# Patient Record
Sex: Male | Born: 1935 | Race: White | Hispanic: No | Marital: Married | Smoking: Never smoker
Health system: Southern US, Community
[De-identification: ages and names within clinical notes are randomized; demographics above are authoritative.]

## PROBLEM LIST (undated history)

## (undated) DIAGNOSIS — D631 Anemia in chronic kidney disease: Secondary | ICD-10-CM

## (undated) DIAGNOSIS — H1851 Endothelial corneal dystrophy: Secondary | ICD-10-CM

## (undated) DIAGNOSIS — N400 Enlarged prostate without lower urinary tract symptoms: Secondary | ICD-10-CM

## (undated) DIAGNOSIS — I10 Essential (primary) hypertension: Secondary | ICD-10-CM

## (undated) DIAGNOSIS — C719 Malignant neoplasm of brain, unspecified: Secondary | ICD-10-CM

## (undated) DIAGNOSIS — F419 Anxiety disorder, unspecified: Secondary | ICD-10-CM

## (undated) DIAGNOSIS — N189 Chronic kidney disease, unspecified: Secondary | ICD-10-CM

## (undated) DIAGNOSIS — R972 Elevated prostate specific antigen [PSA]: Secondary | ICD-10-CM

## (undated) DIAGNOSIS — H9190 Unspecified hearing loss, unspecified ear: Secondary | ICD-10-CM

## (undated) DIAGNOSIS — T380X5A Adverse effect of glucocorticoids and synthetic analogues, initial encounter: Principal | ICD-10-CM

## (undated) DIAGNOSIS — R739 Hyperglycemia, unspecified: Principal | ICD-10-CM

## (undated) DIAGNOSIS — K635 Polyp of colon: Secondary | ICD-10-CM

## (undated) DIAGNOSIS — N184 Chronic kidney disease, stage 4 (severe): Secondary | ICD-10-CM

## (undated) DIAGNOSIS — H18519 Endothelial corneal dystrophy, unspecified eye: Secondary | ICD-10-CM

## (undated) DIAGNOSIS — R600 Localized edema: Secondary | ICD-10-CM

## (undated) DIAGNOSIS — K219 Gastro-esophageal reflux disease without esophagitis: Secondary | ICD-10-CM

## (undated) DIAGNOSIS — R002 Palpitations: Secondary | ICD-10-CM

## (undated) DIAGNOSIS — M1990 Unspecified osteoarthritis, unspecified site: Secondary | ICD-10-CM

## (undated) DIAGNOSIS — J302 Other seasonal allergic rhinitis: Secondary | ICD-10-CM

## (undated) DIAGNOSIS — I129 Hypertensive chronic kidney disease with stage 1 through stage 4 chronic kidney disease, or unspecified chronic kidney disease: Secondary | ICD-10-CM

## (undated) DIAGNOSIS — E785 Hyperlipidemia, unspecified: Secondary | ICD-10-CM

## (undated) HISTORY — PX: KNEE SURGERY: SHX244

## (undated) HISTORY — DX: Other seasonal allergic rhinitis: J30.2

## (undated) HISTORY — DX: Polyp of colon: K63.5

## (undated) HISTORY — DX: Localized edema: R60.0

## (undated) HISTORY — DX: Unspecified osteoarthritis, unspecified site: M19.90

## (undated) HISTORY — DX: Chronic kidney disease, stage 4 (severe): N18.4

## (undated) HISTORY — DX: Hyperglycemia, unspecified: R73.9

## (undated) HISTORY — DX: Hyperlipidemia, unspecified: E78.5

## (undated) HISTORY — DX: Chronic kidney disease, unspecified: N18.9

## (undated) HISTORY — DX: Endothelial corneal dystrophy: H18.51

## (undated) HISTORY — DX: Gastro-esophageal reflux disease without esophagitis: K21.9

## (undated) HISTORY — DX: Adverse effect of glucocorticoids and synthetic analogues, initial encounter: T38.0X5A

## (undated) HISTORY — DX: Benign prostatic hyperplasia without lower urinary tract symptoms: N40.0

## (undated) HISTORY — DX: Hypertensive chronic kidney disease with stage 1 through stage 4 chronic kidney disease, or unspecified chronic kidney disease: I12.9

## (undated) HISTORY — DX: Unspecified hearing loss, unspecified ear: H91.90

## (undated) HISTORY — DX: Endothelial corneal dystrophy, unspecified eye: H18.519

## (undated) HISTORY — DX: Essential (primary) hypertension: I10

## (undated) HISTORY — DX: Elevated prostate specific antigen (PSA): R97.20

## (undated) HISTORY — PX: ROTATOR CUFF REPAIR: SHX139

## (undated) HISTORY — DX: Anxiety disorder, unspecified: F41.9

## (undated) HISTORY — DX: Palpitations: R00.2

## (undated) HISTORY — DX: Anemia in chronic kidney disease: D63.1

---

## 1999-10-30 HISTORY — PX: LUMBAR LAMINECTOMY: SHX95

## 1999-11-13 ENCOUNTER — Ambulatory Visit (HOSPITAL_COMMUNITY): Admission: RE | Admit: 1999-11-13 | Discharge: 1999-11-14 | Payer: Self-pay | Admitting: Neurosurgery

## 2000-10-29 HISTORY — PX: QUADRICEPS TENDON REPAIR: SHX756

## 2001-04-08 ENCOUNTER — Encounter: Payer: Self-pay | Admitting: Orthopedic Surgery

## 2001-04-09 ENCOUNTER — Inpatient Hospital Stay (HOSPITAL_COMMUNITY): Admission: RE | Admit: 2001-04-09 | Discharge: 2001-04-11 | Payer: Self-pay | Admitting: Orthopedic Surgery

## 2002-04-02 ENCOUNTER — Observation Stay (HOSPITAL_COMMUNITY): Admission: RE | Admit: 2002-04-02 | Discharge: 2002-04-03 | Payer: Self-pay | Admitting: Orthopedic Surgery

## 2002-04-03 HISTORY — PX: ACROMIOPLASTY: SHX131

## 2003-10-30 DIAGNOSIS — K635 Polyp of colon: Secondary | ICD-10-CM

## 2003-10-30 HISTORY — DX: Polyp of colon: K63.5

## 2004-09-29 ENCOUNTER — Ambulatory Visit: Payer: Self-pay | Admitting: Gastroenterology

## 2004-10-10 ENCOUNTER — Ambulatory Visit: Payer: Self-pay | Admitting: Gastroenterology

## 2005-08-31 ENCOUNTER — Ambulatory Visit: Payer: Self-pay | Admitting: Family Medicine

## 2005-09-06 ENCOUNTER — Ambulatory Visit: Payer: Self-pay | Admitting: Family Medicine

## 2005-10-18 ENCOUNTER — Ambulatory Visit: Payer: Self-pay | Admitting: Family Medicine

## 2006-10-02 ENCOUNTER — Ambulatory Visit: Payer: Self-pay | Admitting: Family Medicine

## 2006-10-02 LAB — CONVERTED CEMR LAB
ALT: 17 units/L (ref 0–40)
AST: 22 units/L (ref 0–37)
BUN: 28 mg/dL — ABNORMAL HIGH (ref 6–23)
Basophils Absolute: 0 10*3/uL (ref 0.0–0.1)
Basophils Relative: 0.5 % (ref 0.0–1.0)
CO2: 27 meq/L (ref 19–32)
Calcium: 9.2 mg/dL (ref 8.4–10.5)
Chloride: 108 meq/L (ref 96–112)
Chol/HDL Ratio, serum: 3.2
Cholesterol: 143 mg/dL (ref 0–200)
Creatinine, Ser: 2.1 mg/dL — ABNORMAL HIGH (ref 0.4–1.5)
Eosinophil percent: 3.4 % (ref 0.0–5.0)
GFR calc non Af Amer: 33 mL/min
Glomerular Filtration Rate, Af Am: 40 mL/min/{1.73_m2}
Glucose, Bld: 100 mg/dL — ABNORMAL HIGH (ref 70–99)
HCT: 39.3 % (ref 39.0–52.0)
HDL: 45 mg/dL (ref >39.0)
Hemoglobin: 12.9 g/dL — ABNORMAL LOW (ref 13.0–17.0)
LDL Cholesterol: 83 mg/dL (ref 0–99)
Lymphocytes Relative: 24 % (ref 12.0–46.0)
MCHC: 32.9 g/dL (ref 30.0–36.0)
MCV: 92.5 fL (ref 78.0–100.0)
Monocytes Absolute: 0.7 10*3/uL (ref 0.2–0.7)
Monocytes Relative: 11.1 % — ABNORMAL HIGH (ref 3.0–11.0)
Neutro Abs: 3.7 10*3/uL (ref 1.4–7.7)
Neutrophils Relative %: 61 % (ref 43.0–77.0)
PSA: 3.52 ng/mL (ref 0.10–4.00)
Platelets: 249 10*3/uL (ref 150–400)
Potassium: 4.4 meq/L (ref 3.5–5.1)
RBC: 4.24 M/uL (ref 4.22–5.81)
RDW: 12.7 % (ref 11.5–14.6)
Sodium: 141 meq/L (ref 135–145)
TSH: 2.17 microintl units/mL (ref 0.35–5.50)
Triglyceride fasting, serum: 75 mg/dL (ref 0–149)
VLDL: 15 mg/dL (ref 0–40)
WBC: 6.1 10*3/uL (ref 4.5–10.5)

## 2006-12-11 ENCOUNTER — Ambulatory Visit: Payer: Self-pay | Admitting: Family Medicine

## 2006-12-11 LAB — CONVERTED CEMR LAB
BUN: 54 mg/dL — ABNORMAL HIGH (ref 6–23)
CO2: 27 meq/L (ref 19–32)
Calcium: 9.1 mg/dL (ref 8.4–10.5)
Chloride: 105 meq/L (ref 96–112)
Creatinine, Ser: 3 mg/dL — ABNORMAL HIGH (ref 0.4–1.5)
GFR calc Af Amer: 27 mL/min
GFR calc non Af Amer: 22 mL/min
Glucose, Bld: 94 mg/dL (ref 70–99)
Potassium: 4.9 meq/L (ref 3.5–5.1)
Sodium: 143 meq/L (ref 135–145)

## 2006-12-25 ENCOUNTER — Ambulatory Visit: Payer: Self-pay | Admitting: Family Medicine

## 2006-12-25 LAB — CONVERTED CEMR LAB
BUN: 24 mg/dL — ABNORMAL HIGH (ref 6–23)
CO2: 27 meq/L (ref 19–32)
Calcium: 9.2 mg/dL (ref 8.4–10.5)
Chloride: 110 meq/L (ref 96–112)
Creatinine, Ser: 2.2 mg/dL — ABNORMAL HIGH (ref 0.4–1.5)
GFR calc Af Amer: 38 mL/min
GFR calc non Af Amer: 32 mL/min
Glucose, Bld: 97 mg/dL (ref 70–99)
Potassium: 5.2 meq/L — ABNORMAL HIGH (ref 3.5–5.1)
Sodium: 143 meq/L (ref 135–145)

## 2007-02-19 ENCOUNTER — Ambulatory Visit: Payer: Self-pay | Admitting: Family Medicine

## 2007-02-19 LAB — CONVERTED CEMR LAB
ALT: 18 units/L (ref 0–40)
AST: 21 units/L (ref 0–37)
Albumin: 3.6 g/dL (ref 3.5–5.2)
Alkaline Phosphatase: 65 units/L (ref 39–117)
BUN: 22 mg/dL (ref 6–23)
Bilirubin, Direct: 0.2 mg/dL (ref 0.0–0.3)
CO2: 26 meq/L (ref 19–32)
Calcium: 9 mg/dL (ref 8.4–10.5)
Chloride: 111 meq/L (ref 96–112)
Cholesterol: 166 mg/dL (ref 0–200)
Creatinine, Ser: 2.1 mg/dL — ABNORMAL HIGH (ref 0.4–1.5)
GFR calc Af Amer: 40 mL/min
GFR calc non Af Amer: 33 mL/min
Glucose, Bld: 93 mg/dL (ref 70–99)
HDL: 37.5 mg/dL — ABNORMAL LOW (ref >39.0)
LDL Cholesterol: 111 mg/dL — ABNORMAL HIGH (ref 0–99)
Potassium: 5.3 meq/L — ABNORMAL HIGH (ref 3.5–5.1)
Sodium: 143 meq/L (ref 135–145)
Total Bilirubin: 0.7 mg/dL (ref 0.3–1.2)
Total CHOL/HDL Ratio: 4.4
Total Protein: 6.4 g/dL (ref 6.0–8.3)
Triglycerides: 89 mg/dL (ref 0–149)
VLDL: 18 mg/dL (ref 0–40)

## 2007-09-14 ENCOUNTER — Emergency Department (HOSPITAL_COMMUNITY): Admission: EM | Admit: 2007-09-14 | Discharge: 2007-09-14 | Payer: Self-pay | Admitting: Emergency Medicine

## 2007-10-08 ENCOUNTER — Telehealth: Payer: Self-pay | Admitting: Family Medicine

## 2007-10-29 ENCOUNTER — Telehealth: Payer: Self-pay | Admitting: Family Medicine

## 2007-11-26 ENCOUNTER — Ambulatory Visit: Payer: Self-pay | Admitting: Family Medicine

## 2007-11-26 DIAGNOSIS — I10 Essential (primary) hypertension: Secondary | ICD-10-CM

## 2007-11-26 DIAGNOSIS — E785 Hyperlipidemia, unspecified: Secondary | ICD-10-CM

## 2007-11-26 DIAGNOSIS — D649 Anemia, unspecified: Secondary | ICD-10-CM

## 2007-11-26 DIAGNOSIS — E039 Hypothyroidism, unspecified: Secondary | ICD-10-CM | POA: Insufficient documentation

## 2007-11-26 DIAGNOSIS — Z87898 Personal history of other specified conditions: Secondary | ICD-10-CM

## 2007-11-26 DIAGNOSIS — M159 Polyosteoarthritis, unspecified: Secondary | ICD-10-CM | POA: Insufficient documentation

## 2007-11-26 LAB — CONVERTED CEMR LAB
Bilirubin Urine: NEGATIVE
Glucose, Urine, Semiquant: NEGATIVE
Ketones, urine, test strip: NEGATIVE
Nitrite: NEGATIVE
Protein, U semiquant: NEGATIVE
Specific Gravity, Urine: 1.015
Urobilinogen, UA: 0.2
pH: 6

## 2007-11-28 LAB — CONVERTED CEMR LAB
ALT: 18 units/L (ref 0–53)
AST: 18 units/L (ref 0–37)
Alkaline Phosphatase: 72 units/L (ref 39–117)
BUN: 33 mg/dL — ABNORMAL HIGH (ref 6–23)
Basophils Relative: 0.3 % (ref 0.0–1.0)
Bilirubin, Direct: 0.2 mg/dL (ref 0.0–0.3)
CO2: 28 meq/L (ref 19–32)
Calcium: 9.3 mg/dL (ref 8.4–10.5)
Chloride: 107 meq/L (ref 96–112)
Creatinine, Ser: 2.3 mg/dL — ABNORMAL HIGH (ref 0.4–1.5)
Eosinophils Relative: 3.4 % (ref 0.0–5.0)
GFR calc Af Amer: 36 mL/min
Glucose, Bld: 89 mg/dL (ref 70–99)
Hemoglobin: 12.3 g/dL — ABNORMAL LOW (ref 13.0–17.0)
LDL Cholesterol: 136 mg/dL — ABNORMAL HIGH (ref 0–99)
Lymphocytes Relative: 22.3 % (ref 12.0–46.0)
Neutro Abs: 5.6 10*3/uL (ref 1.4–7.7)
Platelets: 231 10*3/uL (ref 150–400)
RDW: 13.6 % (ref 11.5–14.6)
Total Bilirubin: 0.9 mg/dL (ref 0.3–1.2)
Total Protein: 6.7 g/dL (ref 6.0–8.3)
Triglycerides: 116 mg/dL (ref 0–149)
VLDL: 23 mg/dL (ref 0–40)
WBC: 8.5 10*3/uL (ref 4.5–10.5)

## 2007-12-17 ENCOUNTER — Ambulatory Visit: Payer: Self-pay | Admitting: Family Medicine

## 2007-12-22 ENCOUNTER — Emergency Department (HOSPITAL_COMMUNITY): Admission: EM | Admit: 2007-12-22 | Discharge: 2007-12-22 | Payer: Self-pay | Admitting: Emergency Medicine

## 2007-12-23 ENCOUNTER — Ambulatory Visit: Payer: Self-pay | Admitting: Family Medicine

## 2007-12-31 DIAGNOSIS — K589 Irritable bowel syndrome without diarrhea: Secondary | ICD-10-CM | POA: Insufficient documentation

## 2007-12-31 DIAGNOSIS — K219 Gastro-esophageal reflux disease without esophagitis: Secondary | ICD-10-CM

## 2008-01-13 ENCOUNTER — Telehealth: Payer: Self-pay | Admitting: Family Medicine

## 2008-07-07 ENCOUNTER — Ambulatory Visit: Payer: Self-pay | Admitting: Family Medicine

## 2008-07-07 DIAGNOSIS — M2240 Chondromalacia patellae, unspecified knee: Secondary | ICD-10-CM

## 2008-07-07 DIAGNOSIS — R972 Elevated prostate specific antigen [PSA]: Secondary | ICD-10-CM

## 2008-08-04 ENCOUNTER — Ambulatory Visit: Payer: Self-pay | Admitting: Family Medicine

## 2008-09-15 ENCOUNTER — Encounter: Payer: Self-pay | Admitting: Family Medicine

## 2008-09-17 ENCOUNTER — Telehealth: Payer: Self-pay | Admitting: Family Medicine

## 2009-01-05 ENCOUNTER — Ambulatory Visit: Payer: Self-pay | Admitting: Family Medicine

## 2009-01-05 DIAGNOSIS — M5430 Sciatica, unspecified side: Secondary | ICD-10-CM

## 2009-01-11 ENCOUNTER — Ambulatory Visit: Payer: Self-pay | Admitting: Family Medicine

## 2009-01-11 DIAGNOSIS — E559 Vitamin D deficiency, unspecified: Secondary | ICD-10-CM | POA: Insufficient documentation

## 2009-01-11 LAB — HM COLONOSCOPY

## 2009-01-12 ENCOUNTER — Telehealth: Payer: Self-pay | Admitting: Family Medicine

## 2009-01-18 ENCOUNTER — Telehealth: Payer: Self-pay | Admitting: Family Medicine

## 2009-03-16 ENCOUNTER — Ambulatory Visit: Payer: Self-pay | Admitting: Family Medicine

## 2009-03-16 DIAGNOSIS — M549 Dorsalgia, unspecified: Secondary | ICD-10-CM | POA: Insufficient documentation

## 2009-04-05 ENCOUNTER — Encounter: Payer: Self-pay | Admitting: Family Medicine

## 2009-04-26 ENCOUNTER — Telehealth: Payer: Self-pay | Admitting: Family Medicine

## 2009-06-23 ENCOUNTER — Ambulatory Visit: Payer: Self-pay | Admitting: Family Medicine

## 2009-07-06 LAB — CONVERTED CEMR LAB
ALT: 19 units/L (ref 0–53)
Albumin: 3.7 g/dL (ref 3.5–5.2)
CO2: 27 meq/L (ref 19–32)
Calcium: 8.8 mg/dL (ref 8.4–10.5)
Chloride: 109 meq/L (ref 96–112)
Cholesterol: 171 mg/dL (ref 0–200)
Creatinine, Ser: 2.4 mg/dL — ABNORMAL HIGH (ref 0.4–1.5)
Glucose, Bld: 91 mg/dL (ref 70–99)
Potassium: 4.2 meq/L (ref 3.5–5.1)
Sodium: 142 meq/L (ref 135–145)
Total Protein: 6.2 g/dL (ref 6.0–8.3)
Triglycerides: 132 mg/dL (ref 0.0–149.0)
Vit D, 25-Hydroxy: 30 ng/mL (ref 30–89)

## 2009-07-13 ENCOUNTER — Telehealth: Payer: Self-pay | Admitting: Family Medicine

## 2009-07-26 ENCOUNTER — Ambulatory Visit: Payer: Self-pay | Admitting: Family Medicine

## 2009-07-26 ENCOUNTER — Telehealth: Payer: Self-pay | Admitting: Family Medicine

## 2009-07-28 LAB — CONVERTED CEMR LAB
BUN: 29 mg/dL — ABNORMAL HIGH (ref 6–23)
Creatinine, Ser: 2.3 mg/dL — ABNORMAL HIGH (ref 0.4–1.5)

## 2009-07-29 ENCOUNTER — Encounter: Payer: Self-pay | Admitting: Family Medicine

## 2009-08-03 ENCOUNTER — Encounter: Payer: Self-pay | Admitting: Family Medicine

## 2009-09-02 ENCOUNTER — Encounter (INDEPENDENT_AMBULATORY_CARE_PROVIDER_SITE_OTHER): Payer: Self-pay | Admitting: *Deleted

## 2009-09-29 ENCOUNTER — Ambulatory Visit: Payer: Self-pay | Admitting: Family Medicine

## 2009-09-29 DIAGNOSIS — M7660 Achilles tendinitis, unspecified leg: Secondary | ICD-10-CM | POA: Insufficient documentation

## 2010-01-05 ENCOUNTER — Ambulatory Visit: Payer: Self-pay | Admitting: Family Medicine

## 2010-03-06 ENCOUNTER — Encounter (INDEPENDENT_AMBULATORY_CARE_PROVIDER_SITE_OTHER): Payer: Self-pay | Admitting: *Deleted

## 2010-03-30 ENCOUNTER — Encounter (INDEPENDENT_AMBULATORY_CARE_PROVIDER_SITE_OTHER): Payer: Self-pay | Admitting: *Deleted

## 2010-04-03 ENCOUNTER — Ambulatory Visit: Payer: Self-pay | Admitting: Gastroenterology

## 2010-04-17 ENCOUNTER — Ambulatory Visit: Payer: Self-pay | Admitting: Gastroenterology

## 2010-04-21 ENCOUNTER — Ambulatory Visit: Payer: Self-pay | Admitting: Family Medicine

## 2010-04-27 ENCOUNTER — Ambulatory Visit: Payer: Self-pay | Admitting: Family Medicine

## 2010-04-27 DIAGNOSIS — J309 Allergic rhinitis, unspecified: Secondary | ICD-10-CM | POA: Insufficient documentation

## 2010-04-27 DIAGNOSIS — F341 Dysthymic disorder: Secondary | ICD-10-CM | POA: Insufficient documentation

## 2010-04-27 DIAGNOSIS — R944 Abnormal results of kidney function studies: Secondary | ICD-10-CM | POA: Insufficient documentation

## 2010-04-27 LAB — CONVERTED CEMR LAB
AST: 17 units/L (ref 0–37)
Albumin: 3.9 g/dL (ref 3.5–5.2)
Alkaline Phosphatase: 69 units/L (ref 39–117)
Basophils Relative: 0.4 % (ref 0.0–3.0)
CO2: 28 meq/L (ref 19–32)
Calcium: 9 mg/dL (ref 8.4–10.5)
Eosinophils Absolute: 0.2 10*3/uL (ref 0.0–0.7)
HCT: 32 % — ABNORMAL LOW (ref 39.0–52.0)
Hemoglobin: 10.9 g/dL — ABNORMAL LOW (ref 13.0–17.0)
Lymphocytes Relative: 26.1 % (ref 12.0–46.0)
MCHC: 34.2 g/dL (ref 30.0–36.0)
Monocytes Relative: 7.5 % (ref 3.0–12.0)
Neutro Abs: 4.1 10*3/uL (ref 1.4–7.7)
Potassium: 4.8 meq/L (ref 3.5–5.1)
RBC: 3.47 M/uL — ABNORMAL LOW (ref 4.22–5.81)
Sodium: 142 meq/L (ref 135–145)
Total CHOL/HDL Ratio: 6
Total Protein: 6.6 g/dL (ref 6.0–8.3)
Triglycerides: 136 mg/dL (ref 0.0–149.0)

## 2010-05-07 LAB — CONVERTED CEMR LAB
Ketones, urine, test strip: NEGATIVE
Nitrite: NEGATIVE
Urobilinogen, UA: 0.2

## 2010-06-24 ENCOUNTER — Encounter: Payer: Self-pay | Admitting: Family Medicine

## 2010-07-19 ENCOUNTER — Encounter: Payer: Self-pay | Admitting: Family Medicine

## 2010-08-23 ENCOUNTER — Telehealth (INDEPENDENT_AMBULATORY_CARE_PROVIDER_SITE_OTHER): Payer: Self-pay | Admitting: *Deleted

## 2010-11-22 ENCOUNTER — Other Ambulatory Visit: Payer: Self-pay | Admitting: Family Medicine

## 2010-11-22 ENCOUNTER — Ambulatory Visit
Admission: RE | Admit: 2010-11-22 | Discharge: 2010-11-22 | Payer: Self-pay | Source: Home / Self Care | Attending: Family Medicine | Admitting: Family Medicine

## 2010-11-22 DIAGNOSIS — N184 Chronic kidney disease, stage 4 (severe): Secondary | ICD-10-CM | POA: Insufficient documentation

## 2010-11-22 DIAGNOSIS — IMO0002 Reserved for concepts with insufficient information to code with codable children: Secondary | ICD-10-CM | POA: Insufficient documentation

## 2010-11-22 LAB — BASIC METABOLIC PANEL
BUN: 34 mg/dL — ABNORMAL HIGH (ref 6–23)
CO2: 27 mEq/L (ref 19–32)
Calcium: 8.8 mg/dL (ref 8.4–10.5)
Chloride: 110 mEq/L (ref 96–112)
Creatinine, Ser: 2.6 mg/dL — ABNORMAL HIGH (ref 0.4–1.5)
GFR: 26.26 mL/min — ABNORMAL LOW (ref >60.00)
Glucose, Bld: 101 mg/dL — ABNORMAL HIGH (ref 70–99)
Potassium: 4.7 mEq/L (ref 3.5–5.1)
Sodium: 143 mEq/L (ref 135–145)

## 2010-11-26 LAB — CONVERTED CEMR LAB
ALT: 18 units/L (ref 0–53)
AST: 19 units/L (ref 0–37)
Basophils Relative: 0.4 % (ref 0.0–3.0)
Bilirubin, Direct: 0.1 mg/dL (ref 0.0–0.3)
Chloride: 108 meq/L (ref 96–112)
Direct LDL: 140.1 mg/dL
Eosinophils Relative: 2.4 % (ref 0.0–5.0)
GFR calc non Af Amer: 33.03 mL/min (ref >60)
HCT: 40.5 % (ref 39.0–52.0)
HDL: 47.1 mg/dL (ref >39.00)
Lymphs Abs: 1.8 10*3/uL (ref 0.7–4.0)
MCV: 92.5 fL (ref 78.0–100.0)
Monocytes Absolute: 0.5 10*3/uL (ref 0.1–1.0)
Monocytes Relative: 6.7 % (ref 3.0–12.0)
PSA, Free Pct: 18 — ABNORMAL LOW (ref >25)
Platelets: 239 10*3/uL (ref 150.0–400.0)
Potassium: 4.5 meq/L (ref 3.5–5.1)
RBC: 4.37 M/uL (ref 4.22–5.81)
Sodium: 143 meq/L (ref 135–145)
TSH: 1.57 microintl units/mL (ref 0.35–5.50)
Total Bilirubin: 1 mg/dL (ref 0.3–1.2)
Total CHOL/HDL Ratio: 4
Total Protein: 7.4 g/dL (ref 6.0–8.3)
VLDL: 18.8 mg/dL (ref 0.0–40.0)
Vit D, 25-Hydroxy: 27 ng/mL — ABNORMAL LOW (ref 30–89)
WBC: 7.6 10*3/uL (ref 4.5–10.5)

## 2010-11-28 NOTE — Miscellaneous (Signed)
Summary: Orders Update  Clinical Lists Changes  Orders: Added new Referral order of Urology Referral (Urology) - Signed 

## 2010-11-28 NOTE — Letter (Signed)
Summary: [facility] Instructions  [facility]  [address] [place], [place] [address]   Phone: [phone]  Fax: [phone]       Philip Shields    Mar 17, 1933    MRN: [id number]        Procedure Day /Date: Monday 04/17/2010     Arrival Time: 9:30 am      Procedure Time: 10:30 am     Location of Procedure:                    _x _  [facility] (4th Floor)                        PREPARATION FOR COLONOSCOPY WITH MOVIPREP   Starting 5 days prior to your procedure Wednesday 6/15 do not eat nuts, seeds, popcorn, corn, beans, peas,  salads, or any raw vegetables.  Do not take any fiber supplements (e.g. Metamucil, Citrucel, and Benefiber).  THE DAY BEFORE YOUR PROCEDURE         DATE: Sunday 6/19  1.  Drink clear liquids the entire day-NO SOLID FOOD  2.  Do not drink anything colored red or purple.  Avoid juices with pulp.  No orange juice.  3.  Drink at least 64 oz. (8 glasses) of fluid/clear liquids during the day to prevent dehydration and help the prep work efficiently.  CLEAR LIQUIDS INCLUDE: Water Jello Ice Popsicles Tea (sugar ok, no milk/cream) Powdered fruit flavored drinks Coffee (sugar ok, no milk/cream) Gatorade Juice: apple, white grape, white cranberry  Lemonade Clear bullion, consomm, broth Carbonated beverages (any kind) Strained chicken noodle soup Hard Candy                             4.  In the morning, mix first dose of MoviPrep solution:    Empty 1 Pouch A and 1 Pouch B into the disposable container    Add lukewarm drinking water to the top line of the container. Mix to dissolve    Refrigerate (mixed solution should be used within 24 hrs)  5.  Begin drinking the prep at 5:00 p.m. The MoviPrep container is divided by 4 marks.   Every 15 minutes drink the solution down to the next mark (approximately 8 oz) until the full liter is complete.   6.  Follow completed prep with 16 oz of clear liquid of your choice (Nothing  red or purple).  Continue to drink clear liquids until bedtime.  7.  Before going to bed, mix second dose of MoviPrep solution:    Empty 1 Pouch A and 1 Pouch B into the disposable container    Add lukewarm drinking water to the top line of the container. Mix to dissolve    Refrigerate  THE DAY OF YOUR PROCEDURE      DATE: Monday 6/20  Beginning at 5:30 am (5 hours before procedure):         1. Every 15 minutes, drink the solution down to the next mark (approx 8 oz) until the full liter is complete.  2. Follow completed prep with 16 oz. of clear liquid of your choice.    3. You may drink clear liquids until 8:30 am  (2 HOURS BEFORE PROCEDURE).   MEDICATION INSTRUCTIONS  Unless otherwise instructed, you should take regular prescription medications with a small sip of water   as early as possible the morning of  your procedure.         OTHER INSTRUCTIONS  You will need a responsible adult at least 75 years of age to accompany you and drive you home.   This person must remain in the waiting room during your procedure.  Wear loose fitting clothing that is easily removed.  Leave jewelry and other valuables at home.  However, you may wish to bring a book to read or  an iPod/MP3 player to listen to music as you wait for your procedure to start.  Remove all body piercing jewelry and leave at home.  Total time from sign-in until discharge is approximately 2-3 hours.  You should go home directly after your procedure and rest.  You can resume normal activities the  day after your procedure.  The day of your procedure you should not:   Drive   Make legal decisions   Operate machinery   Drink alcohol   Return to work  You will receive specific instructions about eating, activities and medications before you leave.    The above instructions have been reviewed and explained to me by   Karl Bales RN  April 03, 2010 10:24 AM    I fully understand and can  verbalize these instructions _____________________________ Date _________

## 2010-11-28 NOTE — Assessment & Plan Note (Signed)
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 Christella Hartigan which revealed no evidence of carcinoma  EKG  Procedure date:  04/27/2010  Findings:      sinus rhythm with rate of:  70 Sinus Rhythm  Normal EKG   Allergies (verified): No Known Drug Allergies  Past History:  Social History: Last updated: 11/26/2007 Occupation: Married Former Smoker Alcohol use-yes  Risk Factors: Smoking Status: quit (11/26/2007)  Review of Systems      See HPI  The patient denies anorexia, fever, weight loss, weight gain, vision loss, decreased hearing, hoarseness, chest pain, syncope, dyspnea on exertion, peripheral edema, prolonged cough, headaches, hemoptysis, abdominal pain, melena, hematochezia, severe indigestion/heartburn, hematuria, incontinence, genital sores, muscle weakness, suspicious skin lesions, transient blindness, difficulty walking, depression, unusual  weight change, abnormal bleeding, enlarged lymph nodes, angioedema, breast masses, and testicular masses.    Physical Exam  General:  Well-developed,well-nourished,in no acute distress; alert,appropriate and cooperative throughout examination Head:  Normocephalic and atraumatic without obvious abnormalities. No apparent alopecia or balding. Eyes:  No corneal or conjunctival inflammation noted. EOMI. Perrla. Funduscopic exam benign, without hemorrhages, exudates or papilledema. Vision grossly normal. Ears:  External ear exam shows no significant lesions or deformities.  Otoscopic examination reveals clear canals, tympanic membranes are intact bilaterally without bulging, retraction, inflammation or discharge. Hearing is grossly normal bilaterally. Nose:  moderately boggy pale nasal  mucosa with clear drainage Mouth:  Oral mucosa and oropharynx without lesions or exudates.  Teeth in good repair. Neck:  No deformities, masses, or tenderness noted. Chest Wall:  No deformities, masses, tenderness or gynecomastia noted. Breasts:  No masses or gynecomastia noted Lungs:  Normal respiratory effort, chest expands symmetrically. Lungs are clear to auscultation, no crackles or wheezes. Heart:  Normal rate and regular rhythm. S1 and S2 normal without gallop, murmur, click, rub or other extra sounds. Abdomen:  Bowel sounds positive,abdomen soft and non-tender without masses, organomegaly or hernias noted. Rectal:  No external abnormalities noted. Normal sphincter tone. No rectal masses or tenderness. Genitalia:  Testes bilaterally descended without nodularity, tenderness or masses. No scrotal masses or lesions. No penis lesions or urethral discharge. Prostate:  no gland enlargement, no nodules, and no asymmetry.   Msk:  bilateral scars for quadriceps tears bilaterally Pulses:  R and L carotid,radial,femoral,dorsalis pedis and  posterior tibial pulses are full and equal bilaterally Extremities:  No clubbing,  cyanosis, edema, or deformity noted with normal full range of motion of all joints.   Neurologic:  No cranial nerve deficits noted. Station and gait are normal. Plantar reflexes are down-going bilaterally. DTRs are symmetrical throughout. Sensory, motor and coordinative functions appear intact. Skin:  Intact without suspicious lesions or rashes Cervical Nodes:  No lymphadenopathy noted Axillary Nodes:  No palpable lymphadenopathy Inguinal Nodes:  No significant adenopathy Psych:  Cognition and judgment appear intact. Alert and cooperative with normal attention span and concentration. No apparent delusions, illusions, hallucinations   Impression & Recommendations:  Problem # 1:  ALLERGIC RHINITIS (ICD-477.9) Assessment Improved  The following medications were removed from the medication list:    Veramyst 27.5 Mcg/spray Susp (Fluticasone furoate) .Marland Kitchen... 2 sprays each nostril qd His updated medication list for this problem includes:    Fexofenadine Hcl 180 Mg Tabs (Fexofenadine hcl) ..... One once daily    Flonase 50 Mcg/act Susp (Fluticasone propionate) .Marland Kitchen... 2 sprays each nostril once daily  Orders: Prescription Created Electronically [phone])  Problem # 2:  ANXIETY DEPRESSION (ICD-300.4) Assessment: Improved  alprazolam oh 0.25 mg t.i.d. p.r.n.  Orders: Prescription Created Electronically [phone])  Problem # 3:  BACK PAIN (ICD-724.5) Assessment: Unchanged  The following medications were removed from the medication list:    Celebrex 200 Mg Caps (Celecoxib) .Marland Kitchen... 1 tab two times a day    Lortab 10 10-500 Mg Tabs (Hydrocodone-acetaminophen) .Marland Kitchen... 1 q4h as needed for pain. His updated medication list for this problem includes:    Aspirin Adult Low Strength 81 Mg Tbec (Aspirin) .Marland Kitchen... 1 qd    Hydrocodone-acetaminophen 10-500 Mg Tabs (Hydrocodone-acetaminophen) .Marland Kitchen... 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 1 tab two times a day    Lortab 10 10-500 Mg Tabs (Hydrocodone-acetaminophen) .Marland Kitchen... 1 q4h as needed for pain. His updated medication list for this problem includes:    Aspirin Adult Low Strength 81 Mg Tbec (Aspirin) .Marland Kitchen... 1 qd    Hydrocodone-acetaminophen 10-500 Mg Tabs (Hydrocodone-acetaminophen) .Marland Kitchen... 1 q4h as needed pain  Problem # 5:  IBS (ICD-564.1) Assessment: Improved Lomotil for diarrhea p.r.n.  Problem # 6:  GERD (ICD-530.81) Assessment: Improved  His updated medication list for this problem includes:    Omeprazole 20 Mg Cpdr (Omeprazole) .Marland Kitchen... 1 two times a day for gerd  Problem # 7:  HYPERTENSION (ICD-401.9) Assessment: Improved  His updated medication list for this problem includes:    Cozaar 100 Mg Tabs (Losartan potassium) ..... Once daily  Problem # 8:  ARTHRITIS (ICD-716.90) Assessment: Unchanged  Problem # 9:  HYPERLIPIDEMIA (ICD-272.4) Assessment: Improved  His updated medication list for this problem includes:    Simvastatin 40 Mg Tabs (Simvastatin) ..... Once daily at bedtime  Problem # 10:  ANEMIA (ICD-285.9) Assessment: Unchanged  His updated medication list for this problem includes:    Nu-iron 150 Mg Caps (Polysaccharide iron complex) .Marland Kitchen... 1 bid  Complete Medication List: 1)  Cozaar 100 Mg Tabs (Losartan potassium) .... Once daily 2)  Fexofenadine Hcl 180 Mg Tabs (Fexofenadine hcl) .... One once daily 3)  Zolpidem Tartrate 10 Mg Tabs (Zolpidem tartrate) .Marland Kitchen.. 1 by mouth at bedtime as needed sleep 4)  Nizoral 2 % Sham (Ketoconazole) .... Use as instructe4d 5)  Simvastatin 40 Mg Tabs (Simvastatin) .Marland KitchenMarland KitchenMarland Kitchen  Once daily at bedtime 6)  Aspirin Adult Low Strength 81 Mg Tbec (Aspirin) .Marland Kitchen.. 1 qd 7)  Alprazolam 0.25 Mg Tbdp (Alprazolam) .Marland Kitchen.. 1 by mouth three times a day as needed stress 8)  Omeprazole 20 Mg Cpdr (Omeprazole) .Marland Kitchen.. 1 two times a day for gerd 9)  Flomax  0.4 Mg Xr24h-cap (Tamsulosin hcl) .Marland Kitchen.. 1 hs 10)  Flonase 50 Mcg/act Susp (Fluticasone propionate) .... 2 sprays each nostril once daily 11)  Vitamin D 13244 Unit Caps (Ergocalciferol) .Marland Kitchen.. 1 by mouth weekly 12)  Valtrex 1 Gm Tabs (Valacyclovir hcl) .... 2 stat onset lesion repeat in 12 hrs 13)  Lomotil 2.5-0.025 Mg Tabs (Diphenoxylate-atropine) .Marland Kitchen.. 1-2 qid as needed diarrhea 14)  Hydrocodone-acetaminophen 10-500 Mg Tabs (Hydrocodone-acetaminophen) .Marland Kitchen.. 1 q4h as needed pain 15)  Hydrocodone-homatropine 5-1.5 Mg/32ml Syrp (Hydrocodone-homatropine) .... 2 tsp q4h as needed cough 16)  Ciprofloxacin Hcl 500 Mg Tabs (Ciprofloxacin hcl) .Marland Kitchen.. 1 bid 17)  Nu-iron 150 Mg Caps (Polysaccharide iron complex) .Marland Kitchen.. 1 bid  Patient Instructions: 1)  Take medications as prescribed for your problems. 2)  A figure doing much better and have 3)   your problems under better controlRefill her medications 4)  Also wrote prescriptions to cover OTC medicines for the Flex plan Prescriptions: ZOLPIDEM TARTRATE 10 MG TABS (ZOLPIDEM TARTRATE) 1 by mouth at bedtime as needed sleep  #30 x 5   Entered and Authorized by:   Judithann Sheen MD   Signed by:   Judithann Sheen MD on 05/07/2010   Method used:   Telephoned to ...       [facility]. [phone]* (retail)       [phone] [address].       [place], [place]  [address]       Ph: [phone]       Fax: [phone]   RxID:   [id number] ALPRAZOLAM 0.25 MG TBDP (ALPRAZOLAM) 1 by mouth three times a day as needed stress  #90 x 5   Entered and Authorized by:   Judithann Sheen MD   Signed by:   Judithann Sheen MD on 04/27/2010   Method used:   Print then Give to Patient   RxID:   [id number] HYDROCODONE-ACETAMINOPHEN 10-500 MG TABS (HYDROCODONE-ACETAMINOPHEN) 1 q4h as needed pain  #100 x 5   Entered and Authorized by:   Judithann Sheen MD   Signed by:   Judithann Sheen MD on 04/27/2010   Method used:    Print then Give to Patient   RxID:   [id number] VALTREX 1 GM TABS (VALACYCLOVIR HCL) 2 stat onset lesion repeat in 12 hrs  #30 x 1   Entered and Authorized by:   Judithann Sheen MD   Signed by:   Judithann Sheen MD on 04/27/2010   Method used:   Electronically to        [facility]. [phone]* (retail)       [phone] [address].       [place], [place]  [address]       Ph: [phone]       Fax: [phone]   RxID:   [id number] FLONASE 50 MCG/ACT SUSP (FLUTICASONE PROPIONATE) 2 sprays each nostril once daily  #3 x 3   Entered and Authorized by:   Judithann Sheen MD   Signed by:  Judithann Sheen MD on 04/27/2010   Method used:   Electronically to        [facility]. [phone]* (retail)       [phone] [address].       [place], [place]  [address]       Ph: [phone]       Fax: [phone]   RxID:   [id number] FLOMAX 0.4 MG XR24H-CAP (TAMSULOSIN HCL) 1 HS  #90 x 3   Entered and Authorized by:   Judithann Sheen MD   Signed by:   Judithann Sheen MD on 04/27/2010   Method used:   Electronically to        [facility]. [phone]* (retail)       [phone] [address].       [place], [place]  [address]       Ph: [phone]       Fax: [phone]   RxID:   [id number] OMEPRAZOLE 20 MG CPDR (OMEPRAZOLE) 1 two times a day for gerd  #180 x 3   Entered and Authorized by:   Judithann Sheen MD   Signed by:   Judithann Sheen MD on 04/27/2010   Method used:   Electronically to        [facility]. [phone]* (retail)       [phone] [address].       [place], [place]  [address]       Ph: [phone]       Fax: [phone]   RxID:   [id number] SIMVASTATIN 40 MG  TABS (SIMVASTATIN) once daily at bedtime  #90 x 3   Entered and Authorized by:   Judithann Sheen MD   Signed by:   Judithann Sheen MD on  04/27/2010   Method used:   Electronically to        [facility]. [phone]* (retail)       [phone] [address].       [place], [place]  [address]       Ph: [phone]       Fax: [phone]   RxID:   [id number] NIZORAL 2 %  SHAM (KETOCONAZOLE) use as instructe4d  #8 oz x 11   Entered and Authorized by:   Judithann Sheen MD   Signed by:   Judithann Sheen MD on 04/27/2010   Method used:   Electronically to        [facility]. [phone]* (retail)       [phone] [address].       [place], [place]  [address]       Ph: [phone]       Fax: [phone]   RxID:   [id number] COZAAR 100 MG TABS (LOSARTAN POTASSIUM) once daily  #90 x 3   Entered and Authorized by:   Judithann Sheen MD   Signed by:   Judithann Sheen MD on 04/27/2010   Method used:   Electronically to        [facility]. [phone]* (retail)       [phone] [address].       [facility]  [place], [place]  [address]       Ph: [phone]       Fax: [phone]   RxID:   [id number] NU-IRON 150 MG CAPS (POLYSACCHARIDE IRON COMPLEX) 1 bid  #60 x 11   Entered and Authorized by:   Judithann Sheen MD   Signed by:   Judithann Sheen MD on 04/27/2010   Method used:   Electronically to        [facility]. [phone]* (retail)       [phone] [address].       [place], [place]  [address]       Ph: [phone]       Fax: [phone]   RxID:   [id number] CIPROFLOXACIN HCL 500 MG TABS (CIPROFLOXACIN HCL) 1 bid  #30 x 0   Entered and Authorized by:   Judithann Sheen MD   Signed by:   Judithann Sheen MD on 04/27/2010   Method used:   Electronically to        [facility]. [phone]* (retail)       [phone] [address].       [place], [place]  [address]       Ph: [phone]       Fax: [phone]   RxID:   [id number]

## 2010-11-28 NOTE — Progress Notes (Signed)
Summary: Flu vaccination  Phone Note From Other Clinic   Summary of Call: Flu vaccination- [facility]      Immunization History:  Influenza Immunization History:    Influenza:  historical (08/22/2010)

## 2010-11-28 NOTE — Miscellaneous (Signed)
Summary: [facility] previsit  Clinical Lists Changes  Medications: Added new medication of MOVIPREP 100 GM  SOLR (PEG-KCL-NACL-NASULF-NA ASC-C) As per prep instructions. - Signed Rx of MOVIPREP 100 GM  SOLR (PEG-KCL-NACL-NASULF-NA ASC-C) As per prep instructions.;  #1 x 0;  Signed;  Entered by: Karl Bales RN;  Authorized by: Rachael Fee MD;  Method used: Electronically to [facility]. #[id number]*, [address] [place], [place], [place]  [address], Ph: [phone], Fax: [phone]    Prescriptions: MOVIPREP 100 GM  SOLR (PEG-KCL-NACL-NASULF-NA ASC-C) As per prep instructions.  #1 x 0   Entered by:   Karl Bales RN   Authorized by:   Rachael Fee MD   Signed by:   Karl Bales RN on 04/03/2010   Method used:   Electronically to        [facility]. [phone]* (retail)       [phone] [address]       [place], [place]  [address]       Ph: [phone]       Fax: [phone]   RxID:   [id number]

## 2010-11-28 NOTE — Procedures (Signed)
Summary: Colonoscopy  Patient: Philip Shields Note: All result statuses are Final unless otherwise noted.  Tests: (1) Colonoscopy (COL)   COL Colonoscopy           DONE     [facility]     [address]     [place], [place]  [address]           COLONOSCOPY PROCEDURE REPORT           PATIENT:  Philip, Shields  MR#:  [id number]     BIRTHDATE:  [birth year], 76 yrs. old  GENDER:  male     ENDOSCOPIST:  Rachael Fee, MD     PROCEDURE DATE:  04/17/2010     PROCEDURE:  Diagnostic Colonoscopy     ASA CLASS:  Class II     INDICATIONS:  3mm polyp removed 2005 by [facility], not retrieved or sent     to pathology; was recommended to have repeat colonoscopy at 5 year     interval     MEDICATIONS:   Fentanyl 75 mcg IV, Versed 8 mg IV           DESCRIPTION OF PROCEDURE:   After the risks benefits and     alternatives of the procedure were thoroughly explained, informed     consent was obtained.  Digital rectal exam was performed and     revealed no rectal masses.   The LB [id number] endoscope     was introduced through the anus and advanced to the cecum, which     was identified by both the appendix and ileocecal valve, without     limitations.  The quality of the prep was adequate, using     MoviPrep.  The instrument was then slowly withdrawn as the colon     was fully examined.     <<PROCEDUREIMAGES>>           FINDINGS:  A normal appearing cecum, ileocecal valve, and     appendiceal orifice were identified. The ascending, hepatic     flexure, transverse, splenic flexure, descending, sigmoid colon,     and rectum appeared unremarkable (see image1, image2, and image3).     Retroflexed views in the rectum revealed no abnormalities.    The     scope was then withdrawn from the patient and the procedure     completed.           COMPLICATIONS:  None     ENDOSCOPIC IMPRESSION:     1) Normal colon     2) No polyps or cancers           RECOMMENDATIONS:     1) Given your age, you  will not need another colonoscopy for     colon cancer screening or polyp surveillance. These types of tests     usually stop around the age 77 (next colonoscopy would be at 10     year interval, age 89 at that point).           ______________________________     Rachael Fee, MD           n.     eSIGNED:   Rachael Fee at 04/17/2010 10:54 AM           Adele Schilder, [phone]  Note: An exclamation mark (!) indicates a result that was not dispersed into the flowsheet. Document Creation Date: 04/17/2010 10:55 AM _______________________________________________________________________  (1) Order result status: Final Collection or  observation date-time: 04/17/2010 10:50 Requested date-time:  Receipt date-time:  Reported date-time:  Referring Physician:   Ordering Physician: Rob Bunting [id number]) Specimen Source:  Source: Launa Grill Order Number: [id number] Lab site:

## 2010-11-28 NOTE — Consult Note (Signed)
Summary: [facility]  [facility]   Imported By: Maryln Gottron 07/25/2010 13:57:49  _____________________________________________________________________  External Attachment:    Type:   Image     Comment:   External Document

## 2010-11-28 NOTE — Assessment & Plan Note (Signed)
Summary: URI/Hep B/dm   Vital Signs:  Patient profile:   77 year old male Weight:      225 pounds O2 Sat:      94 % Temp:     98 degrees F Pulse rate:   100 / minute BP sitting:   134 / 70  (left arm) Cuff size:   large  Vitals Entered By: Pura Spice, RN (January 05, 2010 10:09 AM) CC: cough congestion sore throat questions about hep B    History of Present Illness: this 77 year old white male with control hypertension of one 3470 is complaining of increasing cough and chest congestion as well as sore throat over the past 2-3 days all of these symptoms have been increasing in severity His other complaint is that of insomnia and recommended trying Lunesta 3 mg samples Relates that arthritis is well-controlled with Celebrex and also using hydrocodone when needed Nocturia has improved since taking Flomax  Allergies (verified): No Known Drug Allergies  Past History:  Social History: Last updated: 11/26/2007 Occupation: Married Former Smoker Alcohol use-yes  Risk Factors: Smoking Status: quit (11/26/2007)  Review of Systems  The patient denies anorexia, fever, weight loss, weight gain, vision loss, decreased hearing, hoarseness, chest pain, syncope, dyspnea on exertion, peripheral edema, prolonged cough, headaches, hemoptysis, abdominal pain, melena, hematochezia, severe indigestion/heartburn, hematuria, incontinence, genital sores, muscle weakness, suspicious skin lesions, transient blindness, difficulty walking, depression, unusual weight change, abnormal bleeding, enlarged lymph nodes, angioedema, breast masses, and testicular masses.    Physical Exam  General:  Well-developed,well-nourished,in no acute distress; alert,appropriate and cooperative throughout examination Head:  Normocephalic and atraumatic without obvious abnormalities. No apparent alopecia or balding. Eyes:  No corneal or conjunctival inflammation noted. EOMI. Perrla. Funduscopic exam benign, without  hemorrhages, exudates or papilledema. Vision grossly normal. Ears:  External ear exam shows no significant lesions or deformities.  Otoscopic examination reveals clear canals, tympanic membranes are intact bilaterally without bulging, retraction, inflammation or discharge. Hearing is grossly normal bilaterally. Nose:  marked nasal congestion and erythematous mucosa with yellowish discharge Mouth:  pharynx is injected with some exudate present Lungs:  Normal respiratory effort, chest expands symmetrically. Lungs are clear to auscultation, no crackles or wheezes. Heart:  Normal rate and regular rhythm. S1 and S2 normal without gallop, murmur, click, rub or other extra sounds.   Impression & Recommendations:  Problem # 1:  ACUTE BRONCHITIS (ICD-466.0) Assessment New  The following medications were removed from the medication list:    Maxiflu Dm 60-20-400-500 Mg Tabs (Pseudoephedrine-dm-gg-apap) .Marland Kitchen... 1 by mouth two times a day    Maxiflu Dm 60-20-400-500 Mg Tabs (Pseudoephedrine-dm-gg-apap) .Marland Kitchen... 1 q6h for congestion and cough    Doxycycline Hyclate 100 Mg Tabs (Doxycycline hyclate) .Marland Kitchen... 1 am and pm for infection His updated medication list for this problem includes:    Zithromax Z-pak 250 Mg Tabs (Azithromycin) .Marland Kitchen... 2 stat then 1 qd    Doxycycline Hyclate 100 Mg Tabs (Doxycycline hyclate) .Marland Kitchen... 1 two times a day for uti    Hydrocodone-homatropine 5-1.5 Mg/60ml Syrp (Hydrocodone-homatropine) .Marland Kitchen... 2 tsp q4h as needed cough    Mucinex Dm Maximum Strength 60-1200 Mg Xr12h-tab (Dextromethorphan-guaifenesin) .Marland Kitchen... 1 bid    Zithromax Z-pak 250 Mg Tabs (Azithromycin) .Marland Kitchen... 2 now then 1 each day for infection  Orders: Prescription Created Electronically [phone])  Problem # 2:  URI (ICD-465.9) Assessment: New  The following medications were removed from the medication list:    Maxiflu Dm 60-20-400-500 Mg Tabs (Pseudoephedrine-dm-gg-apap) .Marland Kitchen... 1 by mouth two  times a day    Maxiflu Dm  60-20-400-500 Mg Tabs (Pseudoephedrine-dm-gg-apap) .Marland Kitchen... 1 q6h for congestion and cough His updated medication list for this problem includes:    Fexofenadine Hcl 180 Mg Tabs (Fexofenadine hcl) ..... One once daily    Celebrex 200 Mg Caps (Celecoxib) .Marland Kitchen... 1 tab two times a day    Aspirin Adult Low Strength 81 Mg Tbec (Aspirin) .Marland Kitchen... 1 qd    Hydrocodone-homatropine 5-1.5 Mg/84ml Syrp (Hydrocodone-homatropine) .Marland Kitchen... 2 tsp q4h as needed cough    Mucinex Dm Maximum Strength 60-1200 Mg Xr12h-tab (Dextromethorphan-guaifenesin) .Marland Kitchen... 1 bid  Problem # 3:  ACUTE PHARYNGITIS (ICD-462) Assessment: New  The following medications were removed from the medication list:    Doxycycline Hyclate 100 Mg Tabs (Doxycycline hyclate) .Marland Kitchen... 1 am and pm for infection His updated medication list for this problem includes:    Celebrex 200 Mg Caps (Celecoxib) .Marland Kitchen... 1 tab two times a day    Aspirin Adult Low Strength 81 Mg Tbec (Aspirin) .Marland Kitchen... 1 qd    Zithromax Z-pak 250 Mg Tabs (Azithromycin) .Marland Kitchen... 2 stat then 1 qd    Doxycycline Hyclate 100 Mg Tabs (Doxycycline hyclate) .Marland Kitchen... 1 two times a day for uti    Zithromax Z-pak 250 Mg Tabs (Azithromycin) .Marland Kitchen... 2 now then 1 each day for infection  Problem # 4:  BACK PAIN (ICD-724.5) Assessment: Improved  His updated medication list for this problem includes:    Celebrex 200 Mg Caps (Celecoxib) .Marland Kitchen... 1 tab two times a day    Aspirin Adult Low Strength 81 Mg Tbec (Aspirin) .Marland Kitchen... 1 qd    Lortab 10 10-500 Mg Tabs (Hydrocodone-acetaminophen) .Marland Kitchen... 1 q4h as needed for pain.    Hydrocodone-acetaminophen 10-500 Mg Tabs (Hydrocodone-acetaminophen) .Marland Kitchen... 1 q4h as needed pain  Problem # 5:  HYPERTENSION (ICD-401.9) Assessment: Improved  His updated medication list for this problem includes:    Cozaar 100 Mg Tabs (Losartan potassium) ..... Once daily  Complete Medication List: 1)  Cozaar 100 Mg Tabs (Losartan potassium) .... Once daily 2)  Fexofenadine Hcl 180 Mg Tabs  (Fexofenadine hcl) .... One once daily 3)  Zolpidem Tartrate 10 Mg Tabs (Zolpidem tartrate) .Marland Kitchen.. 1 by mouth at bedtime as needed sleep 4)  Celebrex 200 Mg Caps (Celecoxib) .Marland Kitchen.. 1 tab two times a day 5)  Nizoral 2 % Sham (Ketoconazole) .... Use as instructe4d 6)  Simvastatin 40 Mg Tabs (Simvastatin) .... Once daily at bedtime 7)  Aspirin Adult Low Strength 81 Mg Tbec (Aspirin) .Marland Kitchen.. 1 qd 8)  Alprazolam 0.25 Mg Tbdp (Alprazolam) .Marland Kitchen.. 1 by mouth three times a day as needed strss 9)  Omeprazole 20 Mg Cpdr (Omeprazole) .Marland Kitchen.. 1 two times a day for gerd 10)  Lortab 10 10-500 Mg Tabs (Hydrocodone-acetaminophen) .Marland Kitchen.. 1 q4h as needed for pain. 11)  Flomax 0.4 Mg Xr24h-cap (Tamsulosin hcl) .Marland Kitchen.. 1 hs 12)  Flonase 50 Mcg/act Susp (Fluticasone propionate) .... 2 sprays each nostril once daily 13)  Vitamin D 27253 Unit Caps (Ergocalciferol) .Marland Kitchen.. 1 by mouth weekly 14)  Medrol (pak) 4 Mg Tabs (Methylprednisolone) .... Take as directed. 15)  Valtrex 1 Gm Tabs (Valacyclovir hcl) .... 2 stat onset lesion repeat in 12 hrs 16)  Zithromax Z-pak 250 Mg Tabs (Azithromycin) .... 2 stat then 1 qd 17)  Doxycycline Hyclate 100 Mg Tabs (Doxycycline hyclate) .Marland Kitchen.. 1 two times a day for uti 18)  Lomotil 2.5-0.025 Mg Tabs (Diphenoxylate-atropine) .Marland Kitchen.. 1-2 qid as needed diarrhea 19)  Hydrocodone-acetaminophen 10-500 Mg Tabs (Hydrocodone-acetaminophen) .Marland Kitchen.. 1 q4h  as needed pain 20)  Hydrocodone-homatropine 5-1.5 Mg/50ml Syrp (Hydrocodone-homatropine) .... 2 tsp q4h as needed cough 21)  Veramyst 27.5 Mcg/spray Susp (Fluticasone furoate) .... 2 sprays each nostril qd 22)  Mucinex Dm Maximum Strength 60-1200 Mg Xr12h-tab (Dextromethorphan-guaifenesin) .Marland Kitchen.. 1 bid 23)  Zithromax Z-pak 250 Mg Tabs (Azithromycin) .... 2 now then 1 each day for infection  Patient Instructions: 1)  acute upper eschar transection, bronchitis and pharyngitis 2)  Technique medications as prescribed 3)  U0 maxiflu DM medication that you are to have as  instructed 4)  Good fluid intake 5)  Continue other medications as prescribed Prescriptions: ZITHROMAX Z-PAK 250 MG TABS (AZITHROMYCIN) 2 now then 1 each day for infection  #1 pkge x 0   Entered and Authorized by:   Judithann Sheen MD   Signed by:   Judithann Sheen MD on 01/05/2010   Method used:   Electronically to        [facility]. [phone]* (retail)       [phone] [address].       [place], [place]  [address]       Ph: [phone]       Fax: [phone]   RxID:   [id number]

## 2010-11-28 NOTE — Letter (Signed)
Summary: Previsit letter  [facility]  [address] [place], [place] [address]   Phone: [phone]  Fax: [phone]       03/06/2010 MRN: [id number]  Philip Shields [address] [place], [place]  [address]  Dear [name],  Welcome to the Gastroenterology Division at [facility].    You are scheduled to see a nurse for your pre-procedure visit on 04-03-10 at 10am on the 3rd floor at [facility], [address]  We ask that you try to arrive at our office 15 minutes prior to your appointment time to allow for check-in.  Your nurse visit will consist of discussing your medical and surgical history, your immediate family medical history, and your medications.    Please bring a complete list of all your medications or, if you prefer, bring the medication bottles and we will list them.  We will need to be aware of both prescribed and over the counter drugs.  We will need to know exact dosage information as well.  If you are on blood thinners (Coumadin, Plavix, Aggrenox, Ticlid, etc.) please call our office today/prior to your appointment, as we need to consult with your physician about holding your medication.   Please be prepared to read and sign documents such as consent forms, a financial agreement, and acknowledgement forms.  If necessary, and with your consent, a friend or relative is welcome to sit-in on the nurse visit with you.  Please bring your insurance card so that we may make a copy of it.  If your insurance requires a referral to see a specialist, please bring your referral form from your primary care physician.  No co-pay is required for this nurse visit.     If you cannot keep your appointment, please call [phone] to cancel or reschedule prior to your appointment date.  This allows [place] the opportunity to schedule an appointment for another patient in need of care.    Thank you for choosing [facility] for your medical needs.   We appreciate the opportunity to care for you.  Please visit [place] at our website  to learn more about our practice.                     Sincerely.                                                                                                                   The Gastroenterology Division

## 2010-11-29 HISTORY — PX: TRANSTHORACIC ECHOCARDIOGRAM: SHX275

## 2010-11-30 NOTE — Assessment & Plan Note (Signed)
Summary: LEG/KNEE PAIN/NJR/rsc per dr/cjr   Vital Signs:  Patient profile:   77 year old male Weight:      215 pounds O2 Sat:      16 % Pulse rate:   74 / minute Pulse rhythm:   regular BP sitting:   120 / 80  (left arm)  Vitals Entered By: Pura Spice, RN (November 22, 2010 1:16 PM)  History of Present Illness: To 77 year old white married male this complained of pain in his right knee, pain in the lateral aspect aggravated with prolonged walking but also aggravated with driving or sitting pain initiates below the head of the femur but also in the median most of the time the lateral aspect of the right knee He is also in the day to repeat his be met since his  renal function tests were abnormal on the last examBlood pressure 120/80 Patient Followup  Problems Prior to Update: 1)  Allergic Rhinitis  (ICD-477.9) 2)  Anxiety Depression  (ICD-300.4) 3)  Nonspecific Abnorm Results Kidney Function Study  (ICD-794.4) 4)  Achilles Tendinitis  (ICD-726.71) 5)  Back Pain  (ICD-724.5) 6)  Vitamin D Deficiency  (ICD-268.9) 7)  Sciatica, Left  (ICD-724.3) 8)  Elevated Prostate Specific Antigen  (ICD-790.93) 9)  Chondromalacia Patella, 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) 10)  Ibs  (ICD-564.1) 11)  Gerd  (ICD-530.81) 12)  Hypertension  (ICD-401.9) 13)  Benign Prostatic Hypertrophy, Hx of  (ICD-V13.8) 14)  Arthritis  (ICD-716.90) 15)  Special Screening Malignant Neoplasm of Prostate  (ICD-V76.44) 16)  Hyperlipidemia  (ICD-272.4) 17)  Hypothyroidism  (ICD-244.9) 18)  Anemia  (ICD-285.9)  Current Problems (verified): 1)  Meniscus Tear  (ICD-836.2) 2)  Knee Sprain, Acute  (ICD-844.9) 3)  Kidney Failure  (ICD-586) 4)  Allergic Rhinitis  (ICD-477.9) 5)  Anxiety Depression  (ICD-300.4) 6)  Nonspecific Abnorm Results Kidney Function Study  (ICD-794.4) 7)  Achilles Tendinitis  (ICD-726.71) 8)  Back Pain  (ICD-724.5) 9)  Vitamin D Deficiency  (ICD-268.9) 10)  Sciatica, Left  (ICD-724.3) 11)  Elevated  Prostate Specific Antigen  (ICD-790.93) 12)  Chondromalacia Patella, 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 Once Daily 2)  Fexofenadine Hcl 180 Mg Tabs (Fexofenadine Hcl) .... One Once Daily 3)  Zolpidem Tartrate 10 Mg Tabs (Zolpidem Tartrate) .Marland Kitchen.. 1 By Mouth At Bedtime As Needed Sleep 4)  Nizoral 2 %  Sham (Ketoconazole) .... Use As Instructe4d 5)  Simvastatin 40 Mg  Tabs (Simvastatin) .... Once Daily At Bedtime 6)  Aspirin Adult Low Strength 81 Mg Tbec (Aspirin) .Marland Kitchen.. 1 Qd 7)  Alprazolam 0.25 Mg Tbdp (Alprazolam) .Marland Kitchen.. 1 By Mouth Three Times A Day As Needed Stress 8)  Omeprazole 20 Mg Cpdr (Omeprazole) .Marland Kitchen.. 1 Two Times A Day For Gerd 9)  Flomax 0.4 Mg Xr24h-Cap (Tamsulosin Hcl) .Marland Kitchen.. 1 Hs 10)  Flonase 50 Mcg/act Susp (Fluticasone Propionate) .... 2 Sprays Each Nostril Once Daily 11)  Vitamin D [address] Unit Caps (Ergocalciferol) .Marland Kitchen.. 1 By Mouth Weekly 12)  Valtrex 1 Gm Tabs (Valacyclovir Hcl) .... 2 Stat Onset Lesion Repeat in 12 Hrs 13)  Lomotil 2.5-0.025 Mg Tabs (Diphenoxylate-Atropine) .Marland Kitchen.. 1-2 Qid  As Needed Diarrhea 14)  Hydrocodone-Acetaminophen 10-500 Mg Tabs (Hydrocodone-Acetaminophen) .Marland Kitchen.. 1 Q4h As Needed Pain 15)  Hydrocodone-Homatropine 5-1.5 Mg/68ml Syrp (Hydrocodone-Homatropine) .... 2 Tsp Q4h As Needed Cough 16)  Ciprofloxacin Hcl 500 Mg Tabs (Ciprofloxacin Hcl) .Marland Kitchen.. 1 Bid 17)  Nu-Iron 150 Mg Caps (Polysaccharide Iron Complex) .Marland Kitchen.. 1 Bid  Allergies: No Known Drug Allergies  Review of Systems      See HPI General:  See HPI.  Physical Exam  General:  Well-developed,well-nourished,in no acute distress;  alert,appropriate and cooperative throughout examination Lungs:  Normal respiratory effort, chest expands symmetrically. Lungs are clear to auscultation, no crackles or wheezes. Heart:  Normal rate and regular rhythm. S1 and S2 normal without gallop, murmur, click, rub or other extra sounds. Msk:  tenderness over the lateral meniscus of the right knee no edema no discoloration examination of the right hip is negative sacroiliac joint is normal Extremities:  No clubbing, cyanosis, edema, or deformity noted with normal full range of motion of all joints.     Complete Medication List: 1)  Cozaar 100 Mg Tabs (Losartan potassium) .... Once daily 2)  Fexofenadine Hcl 180 Mg Tabs (Fexofenadine hcl) .... One once daily 3)  Zolpidem Tartrate 10 Mg Tabs (Zolpidem tartrate) .Marland Kitchen.. 1 by mouth at bedtime as needed sleep 4)  Nizoral 2 % Sham (Ketoconazole) .... Use as instructe4d 5)  Simvastatin 40 Mg Tabs (Simvastatin) .... Once daily at bedtime 6)  Aspirin Adult Low Strength 81 Mg Tbec (Aspirin) .Marland Kitchen.. 1 qd 7)  Alprazolam 0.25 Mg Tbdp (Alprazolam) .Marland Kitchen.. 1 by mouth three times a day as needed stress 8)  Omeprazole 20 Mg Cpdr (Omeprazole) .Marland Kitchen.. 1 two times a day for gerd 9)  Flomax 0.4 Mg Xr24h-cap (Tamsulosin hcl) .Marland Kitchen.. 1 hs 10)  Flonase 50 Mcg/act Susp (Fluticasone propionate) .... 2 sprays each nostril once daily 11)  Vitamin D 91478 Unit Caps (Ergocalciferol) .Marland Kitchen.. 1 by mouth weekly 12)  Valtrex 1 Gm Tabs (Valacyclovir hcl) .... 2 stat onset lesion repeat in 12 hrs 13)  Lomotil 2.5-0.025 Mg Tabs (Diphenoxylate-atropine) .Marland Kitchen.. 1-2 qid as needed diarrhea 14)  Hydrocodone-acetaminophen 10-500 Mg Tabs (Hydrocodone-acetaminophen) .Marland Kitchen.. 1 q4h as needed pain 15)  Hydrocodone-homatropine 5-1.5 Mg/77ml Syrp (Hydrocodone-homatropine) .... 2 tsp q4h as needed cough 16)  Ciprofloxacin Hcl 500 Mg Tabs (Ciprofloxacin hcl) .Marland Kitchen.. 1 bid 17)  Nu-iron 150 Mg Caps (Polysaccharide iron complex) .Marland Kitchen.. 1 bid 18)  Prednisone 20 Mg Tabs  (Prednisone) .Marland Kitchen.. 1 tidpc for 3 days then 1 bidpc for 6 days then 1 per day for inflammation knee  Other Orders: TLB-BMP (Basic Metabolic Panel-BMET) (80048-METABOL) Venipuncture (29562) Specimen Handling (13086) Depo- Medrol 80mg  (J1040) Depo- Medrol 40mg  (J1030) Prescription Created Electronically [phone]  Patient Instructions: 1)  ACUTE STRAN KNEE, TORN MENISCUS 2)  WEARBRACE WHEN VERY ACTIVE, NO TREADMILL 3)  DEPOMEDROL 120 MG im 4)  PREDNISONE 20 MG DECREAING DOSAGE 5)  CALL WITHIN WEEK Prescriptions: PREDNISONE 20 MG TABS (PREDNISONE) 1 tidpc for 3 days then 1 bidpc for 6 days then 1 per day for inflammation knee  #30 x 1   Entered and Authorized by:   Judithann Sheen MD   Signed by:   Judithann Sheen MD on 11/22/2010   Method used:   Electronically to        [facility]. [phone]* (retail)       [phone] [address].       [facility] [place], [place]  [address]       Ph: [phone]       Fax: [phone]   RxID:   [id number]    Medication Administration  Injection # 1:    Medication: Depo- Medrol 80mg     Diagnosis: ARTHRITIS (ICD-716.90)    Route: IM    Site: RUOQ gluteus    Exp Date: 04/2013    Lot #: [id number]    Mfr: [employer]    Patient tolerated injection without complications  Injection # 2:    Medication: Depo- Medrol 40mg     Diagnosis: ARTHRITIS (ICD-716.90)    Route: IM    Site: RUOQ gluteus    Exp Date: 07/14    Lot #: [id number]    Mfr: [employer]    Patient tolerated injection without complications    Given by: Judithann Sheen MD (November 22, 2010 2:28 PM)  Orders Added: 1)  TLB-BMP (Basic Metabolic Panel-BMET) [80048-METABOL] 2)  Venipuncture [40347] 3)  Specimen Handling [99000] 4)  Depo- Medrol 80mg  [J1040] 5)  Depo- Medrol 40mg  [J1030] 6)  Prescription Created Electronically [G8553] 7)  Est. Patient Level IV [42595]     Appended Document: LEG/KNEE PAIN/NJR/rsc per dr/cjr

## 2010-12-01 ENCOUNTER — Telehealth: Payer: Self-pay | Admitting: *Deleted

## 2010-12-01 NOTE — Telephone Encounter (Signed)
Will call and discuss problem of BP

## 2010-12-01 NOTE — Telephone Encounter (Signed)
Was taken off of Losartan.  BP 145/80, but is usually is lower. Nephrologist took him off of his meds, he thinks.

## 2010-12-04 ENCOUNTER — Other Ambulatory Visit: Payer: Self-pay | Admitting: Family Medicine

## 2010-12-04 ENCOUNTER — Telehealth: Payer: Self-pay | Admitting: Family Medicine

## 2010-12-04 DIAGNOSIS — G4700 Insomnia, unspecified: Secondary | ICD-10-CM

## 2010-12-04 NOTE — Telephone Encounter (Signed)
Discussed with pt BP, to stay off  Losartin, if stys above 140-150 then call to change to another medication

## 2010-12-04 NOTE — Telephone Encounter (Signed)
Please advise of refill

## 2010-12-04 NOTE — Telephone Encounter (Signed)
Wants to talk with Dr Scotty Court about his bp meds.

## 2010-12-07 ENCOUNTER — Telehealth: Payer: Self-pay | Admitting: *Deleted

## 2010-12-07 NOTE — Telephone Encounter (Signed)
Refill zolpidem 12.5 mg 1 po qhs prn for sleep

## 2010-12-08 NOTE — Telephone Encounter (Signed)
Ok x 5 per Dr Stafford 

## 2010-12-12 ENCOUNTER — Observation Stay (HOSPITAL_COMMUNITY)
Admission: EM | Admit: 2010-12-12 | Discharge: 2010-12-14 | Disposition: A | Discharge status: Home / Self Care | Payer: BC Managed Care – PPO | Attending: Internal Medicine | Admitting: Internal Medicine

## 2010-12-12 DIAGNOSIS — I129 Hypertensive chronic kidney disease with stage 1 through stage 4 chronic kidney disease, or unspecified chronic kidney disease: Secondary | ICD-10-CM | POA: Insufficient documentation

## 2010-12-12 DIAGNOSIS — E7800 Pure hypercholesterolemia, unspecified: Principal | ICD-10-CM | POA: Insufficient documentation

## 2010-12-12 DIAGNOSIS — N400 Enlarged prostate without lower urinary tract symptoms: Secondary | ICD-10-CM | POA: Insufficient documentation

## 2010-12-12 DIAGNOSIS — N189 Chronic kidney disease, unspecified: Secondary | ICD-10-CM | POA: Insufficient documentation

## 2010-12-12 DIAGNOSIS — R002 Palpitations: Secondary | ICD-10-CM | POA: Insufficient documentation

## 2010-12-12 DIAGNOSIS — K458 Other specified abdominal hernia without obstruction or gangrene: Secondary | ICD-10-CM | POA: Insufficient documentation

## 2010-12-13 LAB — DIFFERENTIAL
Basophils Relative: 0 % (ref 0–1)
Eosinophils Absolute: 0.2 10*3/uL (ref 0.0–0.7)
Monocytes Absolute: 0.6 10*3/uL (ref 0.1–1.0)
Monocytes Relative: 7 % (ref 3–12)

## 2010-12-13 LAB — POCT I-STAT, CHEM 8
BUN: 34 mg/dL — ABNORMAL HIGH (ref 6–23)
Calcium, Ion: 1.14 mmol/L (ref 1.12–1.32)
Creatinine, Ser: 2.3 mg/dL — ABNORMAL HIGH (ref 0.4–1.5)
TCO2: 25 mmol/L (ref 0–100)

## 2010-12-13 LAB — POCT CARDIAC MARKERS
CKMB, poc: 1.2 ng/mL (ref 1.0–8.0)
Myoglobin, poc: 90.8 ng/mL (ref 12–200)
Troponin i, poc: <0.05 ng/mL (ref 0.00–0.09)

## 2010-12-13 LAB — CBC
Hemoglobin: 11.5 g/dL — ABNORMAL LOW (ref 13.0–17.0)
MCH: 30.2 pg (ref 26.0–34.0)
MCHC: 32.9 g/dL (ref 30.0–36.0)
Platelets: 174 10*3/uL (ref 150–400)

## 2010-12-13 LAB — CK TOTAL AND CKMB (NOT AT ARMC)
Relative Index: INVALID (ref 0.0–2.5)
Total CK: 41 U/L (ref 7–232)

## 2010-12-13 LAB — CARDIAC PANEL(CRET KIN+CKTOT+MB+TROPI): CK, MB: 1.5 ng/mL (ref 0.3–4.0)

## 2010-12-14 LAB — CBC
HCT: 34.4 % — ABNORMAL LOW (ref 39.0–52.0)
MCHC: 32.3 g/dL (ref 30.0–36.0)
RDW: 13.6 % (ref 11.5–15.5)

## 2010-12-14 LAB — COMPREHENSIVE METABOLIC PANEL
ALT: 26 U/L (ref 0–53)
Calcium: 8.6 mg/dL (ref 8.4–10.5)
GFR calc Af Amer: 33 mL/min — ABNORMAL LOW (ref >60)
Glucose, Bld: 109 mg/dL — ABNORMAL HIGH (ref 70–99)
Sodium: 140 mEq/L (ref 135–145)
Total Protein: 5.8 g/dL — ABNORMAL LOW (ref 6.0–8.3)

## 2010-12-14 LAB — CARDIAC PANEL(CRET KIN+CKTOT+MB+TROPI)
CK, MB: 1.3 ng/mL (ref 0.3–4.0)
Total CK: 38 U/L (ref 7–232)
Troponin I: <0.01 ng/mL (ref 0.00–0.06)

## 2010-12-19 ENCOUNTER — Telehealth: Payer: Self-pay | Admitting: Family Medicine

## 2010-12-19 NOTE — Telephone Encounter (Signed)
Needs refill hydrocodone and blod pressure monitor

## 2010-12-19 NOTE — Telephone Encounter (Signed)
Pt needs to speak with Dr Scotty Court about medications.... Pt can be reached at [phone].

## 2010-12-22 NOTE — H&P (Signed)
NAMEEDREI, Philip Shields                 ACCOUNT NO.:  [id number]  MEDICAL RECORD NO.:  [id number]           PATIENT TYPE:  E  LOCATION:  [facility]                         FACILITY:  [facility]  PHYSICIAN:  Hartley Barefoot, MD    DATE OF BIRTH:  [birth year]  DATE OF ADMISSION:  12/13/2010 DATE OF DISCHARGE:                             HISTORY & PHYSICAL   CHIEF COMPLAINT:  Increased irregular heart rate and palpitation.  HISTORY OF PRESENT ILLNESS:  This is a very pleasant 77 year old with past medical history of hypertension, chronic kidney disease, dyslipidemia who presented to the emergency department complaining of palpitations and increased irregular heart rate while using an exercise bike.  The event occurred around 7:30 p.m.  the day prior to admission.  It lasted for several hours of duration per the patient.  He denies any chest pain or shortness of breath during the episode.  He related that when he was using the bike, his heart rate went up and down and he felt that his heart rate was irregular when he palpated his pulse. He denies chest pain, shortness of breath, cough, diarrhea.  Otherwise, he is feeling okay.  ALLERGIES:  No known drug allergies.  SOCIAL HISTORY:  He denies alcohol.  He used to be a Teacher, early years/pre.  He is married, he has 2 children.  FAMILY HISTORY:  Positive for diabetes on his mother as well as hypertension.  His father had liver cancer.  No history of premature coronary artery disease.  PAST MEDICAL HISTORY: 1. Hypertension. 2. Chronic kidney disease. 3. BPH. 4. Dyslipidemia. 5. Lumbar hernia.  PAST SURGICAL HISTORY:  Rupture of quadriceps tendon on right knee and status post exploration of the right knee joint with repair of quadriceps tendon, right knee.  Post lumbar disk procedure.  MEDICATIONS: 1. Calcitriol 0.25 mcg every morning. 2. Hydrocodone 10/500 mg every 6 hours as needed. 3. Fexofenadine 118 mg p.o. daily. 4. Alprazolam 0.5 mg p.o. as  needed. 5. Aspirin 81 mg p.o. daily. 6. Simvastatin 40 mg p.o. at that time. 7. Flomax 0.4 mg daily at bedtime. 8. Omeprazole 40 mg twice daily.  PHYSICAL EXAMINATION:  VITAL SIGNS:  Blood pressure 145/75, respirations 16, temperature 98.3, pulse 145, pulse 78. GENERAL:  The patient lying in bed in no acute distress. HEENT:  Head atraumatic, normocephalic.  Eyes anicteric.  Pupils are equal and reactive to light. NECK:  Supple.  No thyromegaly.  No JVD. CARDIOVASCULAR:  S1 and S2.  Regular rhythm and rate.  No rubs, murmurs, or gallops. LUNGS:  Bilaterally clear to auscultation. ABDOMEN:  Bowel sounds positive.  Soft, nontender, nondistended.  No rigidity. EXTREMITIES:  Pulse positive.  No edema. NEUROLOGIC:  Nonfocal.  ADMISSION LABS:  Cardiac point-of-care marker 0.05, white blood cell 9.4, hemoglobin 11.5, hematocrit 35.0, platelets 174.  Sodium 140, potassium 4.6, chloride 107, glucose 125, BUN 34, creatinine 2.3, CO2 of 25.  ASSESSMENT AND PLAN:  This is a 78 year old that presents complaining of a irregular rhythm and increased heart rate while using his exercise bike. 1. Increased heart rate, irregular rhythm.  We will admit for  observation to telemetry.  We will cycle cardiac enzymes, 2-D echo.     We will check TSH.  Cardiology was already consulted by ED     physician.  We will follow the recommendations. 2. Hypertension.  Hydralazine p.r.n. 3. Benign prostatic hypertrophy.  Continue with Flomax. 4. Chronic kidney disease.  The patient related that he was diagnosed     with chronic kidney diseases 2 weeks ago.  He follows with Dr.     Darrick Penna.  I will repeat a BMET in the morning.  He said that     losartan was stopped 2 weeks ago.  I will continue to hold     losartan. 5. Etics:  the patient is a full code.     Hartley Barefoot, MD     BR/MEDQ  D:  12/13/2010  T:  12/13/2010  Job:  [id number]  Electronically Signed by Hartley Barefoot MD on 12/22/2010  03:20:30 PM

## 2010-12-22 NOTE — Discharge Summary (Signed)
NAMECELESTER, Philip Shields                 ACCOUNT NO.:  [id number]  MEDICAL RECORD NO.:  [id number]           PATIENT TYPE:  I  LOCATION:  2008                         FACILITY:  [facility]  PHYSICIAN:  Hartley Barefoot, MD    DATE OF BIRTH:  1933/05/15  DATE OF ADMISSION:  12/12/2010 DATE OF DISCHARGE:  12/14/2010                              DISCHARGE SUMMARY   DISCHARGE DIAGNOSIS:  Palpitation.  OTHER PAST MEDICAL HISTORY: 1. Hypertension. 2. Chronic kidney disease. 3. BPH. 4. Dyslipidemia. 5. Lumbar hernia.  DISCHARGE MEDICATIONS: 1. Alprazolam 0.5 mg 1 tablet daily as needed for anxiety. 2. Aspirin 81 mg p.o. daily. 3. Calcitriol 0.25 mcg 1 capsule by mouth every morning. 4. Fexofenadine 180 mg 1 tablet by mouth daily. 5. Flomax 0.4 mg 1 capsule by mouth daily at bedtime. 6. Hydrocodone/Tylenol 10/500 half tablet by mouth every 6 hours as     needed. 7. Omeprazole 40 mg 1 capsule by mouth twice daily. 8. Simvastatin 40 mg 1 tablet by mouth daily at bedtime.  DISPOSITION AND FOLLOWUP:  Mr.  Shields will need to follow with Dr. Allyson Sabal within a week for a Holter monitor for 7-10 days and also for possible stress Myoview.  He will need also to follow up with Dr. Darrick Penna to follow up kidney function.  He will also need a CBC to follow up platelet levels.  PROCEDURE PERFORMED:  2D echo pending at this time.  BRIEF HISTORY OF PRESENT ILLNESS:  This is a very pleasant 77 year old with past medical history of hypertension, chronic kidney disease who presented to the emergency department complaining of palpitations and increased irregular heart rate while using an exercise bike.  The patient denies chest pain or shortness of breath.  HOSPITAL COURSE: 1. Palpitation, feeling of irregular heart rate.  The patient was     admitted to telemetry for observation.  Cardiac enzymes x3     negative, and telemetry monitor, no irregular rhythm observed.     Cardiology was consulted by the  emergency department physician.     They recommend outpatient Myoview for evaluation of coronary artery     disease in this 77 year old with history of hypertension and     diabetes.  They also recommended 7-10 days Holter monitor.  The     patient was advised to stop doing exercise until his follow with     his cardiologist. 2. Chronic kidney disease.  The patient said that he was diagnosed 2     weeks ago with worsening kidney function and chronic kidney     disease.  Per records, a prior creatinine was at 2.6 on January     2012.  Of admission, creatinine was 2.3.  on the day of discharge,     creatinine of 2.35.  The patient will follow up with Dr. Darrick Penna     for further care.  He was on losartan before but this was stopped 2     weeks ago.  I will continue to hold losartan. 3. BPH.  Continue with Flomax. 4. Hypertension.  Blood pressure has remained stable in the 119 and  110 range.  He will need to follow with his nephrologist.  If blood     pressure continues to increase, he might benefit from Norvasc.  On the day of discharge, the patient was in improved condition.  No palpitation.  No arrhythmia on telemetry.  Blood pressure 119/65, sat 93 on room air, respiration 14, pulse 70, temperature 97.8.  DISCHARGE LABORATORY DATA:  Sodium 140, potassium 4.4, chloride 106, bicarb 27, glucose 109, BUN 27, creatinine 2.35.  White blood cell 9.2, hemoglobin 11.1, platelet 127.  The patient was discharged in improved condition.  The patient will be discharged if 2D echo is okay.     Hartley Barefoot, MD     BR/MEDQ  D:  12/14/2010  T:  12/15/2010  Job:  [id number]  cc:   Nanetta Batty, M.D. Dr. Darrick Penna  Electronically Signed by Hartley Barefoot MD on 12/22/2010 03:18:19 PM

## 2010-12-27 ENCOUNTER — Telehealth: Payer: Self-pay | Admitting: Family Medicine

## 2010-12-27 NOTE — Telephone Encounter (Signed)
Pt would like doc to call him at work [phone]

## 2010-12-28 NOTE — Telephone Encounter (Signed)
Refill prevous rx for prednisone

## 2011-01-03 ENCOUNTER — Other Ambulatory Visit: Payer: Self-pay | Admitting: Family Medicine

## 2011-01-10 ENCOUNTER — Other Ambulatory Visit: Payer: Self-pay

## 2011-01-10 MED ORDER — AZITHROMYCIN 250 MG PO TABS: ORAL_TABLET | ORAL | Status: AC

## 2011-01-22 ENCOUNTER — Encounter: Payer: Self-pay | Admitting: Family Medicine

## 2011-02-22 ENCOUNTER — Telehealth: Payer: Self-pay | Admitting: Family Medicine

## 2011-02-22 NOTE — Telephone Encounter (Signed)
Pt called and said that he has to drive to [employer] [facility] [employer]. Pt is req that Dr Scotty Court increase the dosage of this alprazolam? Pls call in to [facility].

## 2011-03-05 ENCOUNTER — Other Ambulatory Visit: Payer: Self-pay | Admitting: Family Medicine

## 2011-03-16 NOTE — Discharge Summary (Signed)
[facility]  Patient:    Philip Shields, Philip Shields                        MRN: [id number] Adm. Date:  [id number] Disc. Date: [id number] Attending:  Skip Mayer Dictator:   Alexzandrew L. Perkins, P.A.-C.                           Discharge Summary  ADMITTING DIAGNOSES: 1. Rupture of the quadriceps tendon of the right knee. 2. Occasional reflux. 3. Status post lumbar disk procedure.  DISCHARGE DIAGNOSES: 1. Complete rupture of quadriceps tendon of right knee status post exploration    of the right knee joint with repair of quadriceps tendon right knee. 2. Occasional reflux. 3. Status post lumbar disk procedure.  PROCEDURE:  The patient was taken to the OR on April 09, 2001 and underwent exploration of the right knee and repair of the complete tear of the quadriceps tendon of the right knee.  Surgeon:  Georges Lynch. Darrelyn Hillock, M.D. Assistant:  Verlin Fester, P.A.-C.  Anesthesia:  General.  CONSULTS:  None.  BRIEF HISTORY:  Patient is a 77 year old male who has been seen by Dr. Darrelyn Hillock for a rupture of his quadriceps tendon.  He was at his sons place at [facility] and injured the knee.  He was followed up in the office and found to have a complete rupture.  He was scheduled for surgery.  LABORATORIES:  CBC on admission:  Hemoglobin 14.4, hematocrit 42.5, white cell count 8.1, red cell count 4.86.  PT/PTT on admission 13.0 and 29 respectively. Serial protimes followed per Coumadin protocol.  Last noted PT/INR prior to discharge was 14.7 and 1.3.  Chem panel on admission all within normal limits. Urinalysis on admission showed trace blood, 0-3 white cells, and only rare epithelial cells with a few highland casts.  Otherwise, urinalysis was negative.  Chest x-ray dated April 08, 2001:  Negative chest for active disease.  Findings are similar to those noted on report from a previous chest film from [facility] on November 09, 1999.  EKG dated April 08, 2001:   Normal sinus rhythm. Normal EKG.  Unconfirmed EKG.  HOSPITAL COURSE:  Patient was admitted to [facility], taken to the OR, underwent the above stated procedure without complications.  Patient tolerated procedure well.  Was later transferred to recovery room, then orthopedic floor for continued postoperative care.  Patient had a fair amount of pain following the procedure.  He was placed into a knee immobilizer which was encouraged to be on at all times.  Physical therapy was consulted to assist with gait training/ambulation.  He was placed on partial weightbearing to the right lower extremity.  Patient did fairly well with physical therapy. By postoperative day #2 on April 11, 2001 patient was doing quite well, been weaned over to p.o. analgesics, and was discharged home with discharged medications.  PLAN:  Discharged home on April 11, 2001.  DISCHARGE DIAGNOSES:  Please see above.  DISCHARGE MEDICATIONS: 1. Coumadin as per protocol. 2. Percocet #40. 3. Robaxin #40.  DIET:  As tolerated.  FOLLOW-UP:  One week.  ACTIVITY:  Knee immobilizer at all times.  Partial weightbearing to the right lower extremity.  DISPOSITION:  Home.  CONDITION ON DISCHARGE:  Improved. DD:  04/11/01 TD:  04/11/01 Job: [id number]

## 2011-03-16 NOTE — H&P (Signed)
[facility]  Patient:    Philip Shields, Philip Shields Visit Number: [id number] MRN: [id number]          Service Type: Attending:  Georges Lynch. Darrelyn Hillock, M.D. Dictated by:   Druscilla Brownie. Shela Nevin, P.A. Adm. Date:  04/02/02   CC:         Tinnie Gens C. Quintella Reichert, M.D.   History and Physical  DATE OF BIRTH:  05/01/1933  CHIEF COMPLAINT:  Pain in left shoulder.  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 Gioffre, M.D., for continuing problems concerning his shoulder.  He injured his left shoulder back in the winter when he fell on some ice.  He has developed limited range of motion of the shoulder with a considerable amount of pain and discomfort.  Abduction is markedly uncomfortable as well.  Routine x-rays of the shoulder were negative, but we sent the patient on for an MRI with a high suspicion of tear of the rotator cuff.  Indeed, a full-thickness tear was noted with impingement.  This is a very active gentleman who is extremely busy.  He is a Teacher, early years/pre at [employer].  He is finding more and more interference with his day-to-day activities from the left shoulder.  For that reason, we decided to go ahead with surgical intervention.  He is going to have an open acromionectomy with repair of the rotator cuff and possible graft.  PAST MEDICAL HISTORY:  This gentleman has really been in good health throughout his lifetime.  He has had no serious illnesses.  He now has some hypertension and mild reflux.  History of renal calculi two or three years ago.  PAST SURGICAL HISTORY:  Repair of left quadriceps tendon rupture in July of 1998.  He had the repair of the right quadriceps tendon rupture in June of 2002.  He had lumbar laminectomy in January of 2001.  CURRENT MEDICATIONS: 1. Cozaar 100 mg one q.d. 2. Vioxx one q.d. 3. Prevacid 30 mg one q.d.  SOCIAL HISTORY:  The patient is married.  He is a  Teacher, early years/pre.  No intake of tobacco nor alcohol products.  He has two children.  FAMILY HISTORY:  Positive for diabetes in his mother, as well as hypertension in the family.  His father had liver cancer.  Lacretia Leigh. Quintella Reichert, M.D., is his family physician and has cleared him for surgery.  REVIEW OF SYSTEMS:  CNS:  No seizure disorder, paralysis, numbness, or double vision.  Respiratory:  No productive cough.  No hemoptysis.  No shortness of breath.  Cardiovascular:  No chest pain.  No angina.  No orthopnea. Gastrointestinal:  No nausea, vomiting, melena, or bloody stools. Genitourinary:  No discharge, dysuria, or hematuria.  Musculoskeletal: Primarily as in the present illness.  PHYSICAL EXAMINATION:  An alert, cooperative, friendly, fully oriented, 77 year old white male.  VITAL SIGNS:  Blood pressure 128/74, pulse 76, respirations 12.  HEENT:  Normocephalic.  PERRLA.  EOMI.  The oropharynx was clear.  CHEST:  Clear to auscultation.  No rhonchi.  No rales.  HEART:  Regular rate and rhythm.  No murmurs are heard.  ABDOMEN:  Obese, soft, and nontender.  Liver and spleen not felt.  GENITALIA:  Not done, not pertinent to present illness.  RECTAL:  Not done, not pertinent to present illness.  EXTREMITIES:  Left shoulder as in the present illness above.  ADMISSION DIAGNOSES: 1. Tear of the rotator cuff of the left shoulder. 2.  Hypertension. 3. Mild reflux.  PLAN:  The patient will undergo open repair of the rotator cuff of the left shoulder with possible graft and acromionectomy.  Should we have any medical problems in the hospital, we will certainly ask Tinnie Gens C. Quintella Reichert, M.D., to follow with [place]. Dictated by:   Druscilla Brownie. Shela Nevin, P.A. Attending:  Georges Lynch. Darrelyn Hillock, M.D. DD:  03/25/02 TD:  03/26/02 Job: [id number]

## 2011-03-16 NOTE — Op Note (Signed)
[facility]  Patient:    JAMORI, BIGGAR Visit Number: [id number] MRN: [id number]          Service Type: SUR Location: 4W [id number] 01 Attending Physician:  Skip Mayer Dictated by:   Georges Lynch Darrelyn Hillock, M.D. Proc. Date: 04/02/02 Admit Date:  04/02/2002 Discharge Date: 04/03/2002   CC:         Tinnie Gens C. Quintella Reichert, M.D.   Operative Report  SURGEON:  Georges Lynch. Darrelyn Hillock, M.D.  ASSISTANTDruscilla Brownie. Underwood III, P.A.-C.  PREOPERATIVE DIAGNOSES: 1. Complete tear of the rotator cuff tendon on the left. 2. Severe impingement syndrome on the left.  POSTOPERATIVE DIAGNOSES: 1. Compete tear to the rotator cuff tendon on the left. 2. Severe impingement syndrome on the left.  OPERATION: 1. Partial acromionectomy and acromioplasty, left shoulder. 2. Repair of a complete tear of the rotator cuff tendon on the left with a    graft-jacket graft tendon graft to the rotator cuff tendon.  DESCRIPTION OF PROCEDURE:  Under general anesthesia, a routine orthopedic prep and draping of the shoulder of the left shoulder was carried out.  Prior to general anesthesia, he had an intrascalene block by anesthesia on the left. At this time, an incision was made over the anterior aspect of the left shoulder, bleeders identified and cauterized.  I stripped the deltoid tendon from the acromion in the usual fashion, and self-retaining retractors were inserted after I split the proximal portion of the deltoid.  Following this, I went down and noted he had severe overgrowth of his acromion.  I protected the remaining cuff with the Bennett retractor, utilized the oscillating saw, did a partial acromionectomy with the oscillating saw.  I then utilized the bur to even out the bone areas.  Following this, I then utilized the bur to bur down the lateral articular surface of the humerus.  I then noted there was full-thickness tear of the rotator cuff tendon.  Following this, I  elected to use a 5 x 5 graft-jacket from [facility].  I folded this on itself three different times, making a nice, thick piece of graft.  I then sutured this to his proximal rotator cuff and then utilized three anchors in the humerus and anchored the remaining part of the graft into the humerus.  We had a nice repair.  I thoroughly irrigated out the area, reapproximated the deltoid tendon and the muscle in the usual fashion, closed the remaining part of the wound in the usual fashion.  The skin was closed with metal staples.  Sterile Neosporin dressing was applied, and he was placed in a shoulder immobilizer. The patient left the operating room in satisfactory condition.  He had 1 g of IV Ancef preop. Dictated by:   Georges Lynch Darrelyn Hillock, M.D. Attending Physician:  Skip Mayer DD:  04/02/02 TD:  04/03/02 Job: [id number]

## 2011-03-16 NOTE — Letter (Signed)
November 04, 2007    To Whom it May Concern   RE:  KELLIN, BARTLING  MRN:  [id number]  /  DOB:  1933-03-31   Dear Londell Moh:   Mr. and Mrs. Augusten Lipkin have been placed under my care for many years  and have continued to be under my care.  They are seen on a regular  basis for their usual medical problems and both have been found to be  mentally and physically in good health.  I would like to stress the  point that they would be of no jeopardy to any child riding in their car  on super highways or in any other place.  They are very healthy as  stated above both physically and mentally and I would like to support  this fact.  If anyone would like to question me regarding this matter,  please do not hesitate to notify me.    Sincerely,      Ellin Saba., MD  Electronically Signed    WRS/MedQ  DD: 11/04/2007  DT: 11/04/2007  Job #: [id number]

## 2011-03-16 NOTE — Op Note (Signed)
[facility]  Patient:    Philip Shields                         MRN: [id number] Proc. Date: 11/13/99 Adm. Date:  [id number] Attending:  Tressie Stalker D                           Operative Report  PREOPERATIVE DIAGNOSIS:  L4-5 grade 1 acquired spondylolisthesis, spinal stenosis, lumbar radiculopathy, lumbago.  POSTOPERATIVE DIAGNOSIS:  L4-5 grade 1 acquired spondylolisthesis, spinal stenosis, lumbar radiculopathy, lumbago.  OPERATION PERFORMED:  Bilateral L4 laminectomy, foraminotomy using microdissection.  SURGEON:  Cristi Loron, M.D.  ASSISTANT:  Alanson Aly. Roxan Hockey, M.D.  ANESTHESIA:  General endotracheal.  ESTIMATED BLOOD LOSS:  150 cc.  SPECIMENS:  None.  DRAINS:  None.  COMPLICATIONS:  None.  INDICATIONS FOR PROCEDURE:  The patient is a 77 year old white male who has suffered from bilateral leg pain when he stands for walking consistent with neurogenic claudication.  He failed medical management and was worked up as an outpatient with lumbar x-ray and lumbar MRI that demonstrated a grade 1 acquired spondylolisthesis at L4-5 with resultant spinal stenosis.  He failed medical management and therefore weighed the risks, benefits and alternatives of surgery and decided to proceed with a lumbar decompression.  DESCRIPTION OF PROCEDURE:  The patient was brought to the operating room by an anesthesia team.  General endotracheal anesthesia was induced.  The patient was  then turned to the prone position on the Wilson frame, his lumbosacral region was then shaved and prepared with Betadine scrub and Betadine solution and sterile drapes were applied.  I then injected the area to be incised with Marcaine with  epinephrine solution and used a scalpel to make a vertical incision over the patients L4-5 interspace.  I used electrocautery to dissect down to the thoracolumbar fascia and divided it bilaterally, performing a  bilateral subperiosteal dissection, stripping the paraspinous musculature from the L4 spinous process and lamina.  I then obtained an intraoperative radiograph that demonstrated I was indeed at L4-5.  I inserted a McCullough retractor for exposure.  I then incised the L3-4 and L4-5 interspinous ligament with the scalpel and then used  Leksell rongeur to remove the spinous process of L4.  I then used a Midas Rex high speed drill to perform bilateral L4 laminotomies drilling until I encountered the insertion of the ligamentum flavum.  I then used the [place] #4 to dissect beneath the ligamentum flavum.  I then used the Kerrison punch to remove the bilateral 4-5 ligamentum flavum and the remainder of the L4 lamina.  I then brought the operating microscope into the field and under additional magnification and illumination,  completed the decompression of the microdissection decompressing the nerves.  I  went out in the lateral recess and I used a Kerrison punch and angled curets to  remove more hypertrophied ligament  ____________ inspected the facet joint.  I then identified the bilateral L5 nerve root and performed a foraminotomy about t. I then palpated about the lateral recesses and noted that the thecal sac was well decompressed as well as the bilateral L5 nerve roots.  I then achieved hemostasis using bipolar electrocautery and Gelfoam.  I then removed the Gelfoam.  I copiously irrigated the wound with bacitracin solution, removed the solution and then removed the McCullough retractor and reapproximated the patient thoracolumbar fascia with interrupted #1  Vicryl and the subcutaneous tissue with interrupted 2-0 Vicryl and the skin with Steri-Strips with benzoin.  The wound was then coated with bacitracin ointment and a sterile dressing was applied.  The drapes were removed and the patient was subsequently returned to the supine position where he was extubated by the  anesthesia team and transported to the post anesthesia care unit in stable condition.  All sponge, needle and instrument counts were correct at the end of  this case. DD:  11/13/99 TD:  11/13/99 Job: [id number]

## 2011-03-16 NOTE — Op Note (Signed)
[facility]  Patient:    TERAN, KNITTLE                        MRN: [id number] Proc. Date: 04/09/01 Adm. Date:  [id number] Attending:  Skip Mayer                           Operative Report  SURGEON:  Dr. Darrelyn Hillock.  ASSISTANT:  Verlin Fester, P.A.-C.  PREOPERATIVE DIAGNOSES:  Complete rupture of quadriceps tendon, right knee.  POSTOPERATIVE DIAGNOSES:  Complete rupture of the quadriceps tendon, right knee.  OPERATION PERFORMED: 1. Exploration of the right knee joint. 2. Repair of the quadriceps tendon, right knee.  DESCRIPTION OF PROCEDURE:  Under general anesthesia, routine orthopedic prep and draping of the right lower extremity was carried out. The patient had 1 gm of IV Ancef preop. The leg was exsanguinated with an Esmarch, the tourniquet was elevated to 350 mmHg. Following this, the incision was made over the anterior aspect of the left knee, bleeders identified and cauterized. I then went down and identified the large disruption of his quadriceps tendon and looked into the knee joint and irrigated the area out and there were no loose fragments and there were no fractures of the patella. At this time, two drill holes were made in the patella and sutures then were passed through the patella drill holes and through the tendon and sutured down in place. I then used peripheral sutures to complete the repair. Number five sutures were used initially and then followed by #2 Ethibond sutures. I then irrigated the area again. The subcu was closed with #0 Vicryl, skin with metal staples. I injected 30 cc of 0.5% Marcaine and epinephrine into the knee joint. The patient was placed in a bundle dressing and then a knee immobilizer. He will be started on the Coumadin protocol. DD:  04/09/01 TD:  04/09/01 Job: [id number]

## 2011-03-16 NOTE — H&P (Signed)
[place]  Patient:    Philip Shields                         MRN: [id number] Adm. Date:  [id number] Attending:  Cristi Loron CC:         Leroy Sea., M.D.                         History and Physical  CHIEF COMPLAINT:  Leg pain.  HISTORY OF PRESENT ILLNESS:  The patient is a 77 year old white male who has been having bilateral leg pain for at least 6 months.  He notices this mainly when he stands or walks for a prolonged period, but does not seem to be related to exertion.  He has been having discomfort radiating down to the posterolateral aspect of his bilateral legs.  Seems equal on both sides.  It is improved when e lies or sits down.  He complained to his primary doctor, Dr. Dianna Limbo, ho started him on Vioxx.  It did not seem to help whole lot so he was tried on Celebrex and it helped somewhat.  He complains of neck and no back pain.  He has some numbness and tingling in the back of his legs.  Occasionally, he has a generalized weak sensation, but no focal weakness.  His discomfort has improved if he bends forward.  He has failed medical management and has been worked up with a lumbar MRI which demonstrated lumbar spinal stenosis at L4-5 with grade 1 spondylolisthesis.  He therefore weighed the risks, benefits and alternatives to surgery and decided to proceed with a decompression.  PAST MEDICAL HISTORY: 1. Borderline hypertension. 2. Left quadricep tendon rupture.  MEDICATIONS PRIOR TO ADMISSION: 1. Zantac 150  mg p.o. b.i.d. 2. Celebrex 200 mg p.o. q.d.  ALLERGIES: NO KNOWN DRUG ALLERGIES.  PAST SURGICAL HISTORY:  Left quadriceps tendon repair July, 1998.  FAMILY MEDICAL HISTORY:  Both his parents died in their 60s.  Mother from diabetes mellitus, heart disease, father from liver cancer.  SOCIAL HISTORY:  The patient is married.  He has 2 children.  He lives in [place].  He is employed as a  Teacher, early years/pre at [employer].  He denies tobacco, ethanol, drug use.  REVIEW OF SYSTEMS:  Negative except as above.  PHYSICAL EXAMINATION:  GENERAL:  He is a pleasant, mildly obese 77 year old white male in no apparent distress.  VITAL SIGNS:  He is 5 feet 11 inches, weight 235 pounds.  HEENT:  Normocephalic, atraumatic.  Pupils equal, round, reactive to light. Extraocular muscles intact.  Sclerae white.  Conjunctivae pink.  Oropharynx benign. Uvula midline.  He is wearing corrective lenses.  NECK:  Supple.  There are no masses, meningismus, deformity, tracheal deviation, jugular venous distention, carotid bruits.  He has a normal cervical range of motion.  Spurlings test is negative.  Lhermittes was not present.  Thorax is symmetric.  LUNGS:  Clear to auscultation without rales, rhonchi, or wheezes.  HEART:  Regular rate, and rhythm.  ABDOMEN:  Soft, nontender.  No guarding or rebound.  EXTREMITIES:  No obvious deformities.  He has a well-healed left surgical scar without signs of infection.  Good pulses.  BACK:  Exam is benign.  There is no deformities, point tenderness.  Straight leg raise testing and Pearlean Brownie testing are negative bilaterally.  He has a normal lumbar range of motion.  NEUROLOGIC:  The patient is alert and oriented x 3.  Cranial nerves II-XII are grossly intact bilaterally.  Vision is grossly normal bilaterally with corrective lenses.  Hearing grossly normal bilaterally.  Motor strength 5/5 bilaterally in deltoids, biceps, triceps, hand grips, obturator, psoas, gastrocnemius, gastrocnemius, _______ longus.  Cerebellar exam is intact to rapid and ulnar movements of the upper extremities bilaterally.  Deep tendon reflexes are 2/4 in bilateral biceps, triceps, brachialis, quadriceps, 1/4 and bilateral gastrocnemius.  He has bilateral flexor and plantar reflexes.  No ankle clonus.  Sensory exam is grossly normal to light touch and pinprick sensation to  all tested dermatomes bilaterally.  DIAGNOSTIC STUDIES:  The patient had a lumbar spine series performed on March 30, 1999 at [facility].  It demonstrates anterior spondylosis at L3-4, nd L4-5.  There is some facet arthropathy at L4-5 and L5-S1.  He also has an MR scan which demonstrates a minimal spondylolisthesis at L4-5 with spinal stenosis.  ASSESSMENT AND PLAN:  Grade 1 acquired spondylolisthesis, spinal stenosis, lumbago, lumbar radiculopathy.  I have discussed the surgery with the patient.  He has failed medical management thus far.  He is having far more leg difficulties and  back difficulties.  In fact he has very little back pain.  I have discussed the  various treatment options with him including continued medical management, doing nothing.  We did discuss steroid injections with him.  I have discussed the surgical options including an L4 laminectomy, foraminotomy, versus that surgery and his spine fusion.  I do not think he needs spine fusion.  He says he does not have much back pain.  I described procedure of a bilateral for laminectomy and foraminotomy.  I showed him surgical models and discussed risks and surgery. The patient weighed the risks, benefits, alternatives of surgery, and decided to proceed the operation on November 13, 1999. DD:  11/13/99 TD:  11/13/99 Job: [id number]

## 2011-03-27 ENCOUNTER — Telehealth: Payer: Self-pay

## 2011-03-27 NOTE — Telephone Encounter (Signed)
Pt called and stated he had discussed a referral to [facility] with Dr. Scotty Court and would like to know if this could be done ASAP. Pt stated that his right knee is hurting bad and he needs this referral

## 2011-04-04 ENCOUNTER — Other Ambulatory Visit: Payer: Self-pay | Admitting: Family Medicine

## 2011-04-04 DIAGNOSIS — M1710 Unilateral primary osteoarthritis, unspecified knee: Secondary | ICD-10-CM

## 2011-04-06 NOTE — Telephone Encounter (Signed)
Done per Dr. Stafford  

## 2011-04-10 ENCOUNTER — Other Ambulatory Visit: Payer: Self-pay | Admitting: Family Medicine

## 2011-04-13 ENCOUNTER — Other Ambulatory Visit: Payer: Self-pay

## 2011-04-13 MED ORDER — OMEPRAZOLE 20 MG PO TBEC: 1.0000 | DELAYED_RELEASE_TABLET | Freq: Two times a day (BID) | ORAL | Status: DC

## 2011-04-13 MED ORDER — TAMSULOSIN HCL 0.4 MG PO CAPS: 0.4000 mg | ORAL_CAPSULE | Freq: Every day | ORAL | Status: DC

## 2011-04-13 MED ORDER — SIMVASTATIN 40 MG PO TABS: 40.0000 mg | ORAL_TABLET | Freq: Every day | ORAL | Status: DC

## 2011-04-13 NOTE — Telephone Encounter (Signed)
rx sent into pharmacy

## 2011-04-17 ENCOUNTER — Other Ambulatory Visit (INDEPENDENT_AMBULATORY_CARE_PROVIDER_SITE_OTHER): Payer: BC Managed Care – PPO

## 2011-04-17 DIAGNOSIS — Z0000 Encounter for general adult medical examination without abnormal findings: Secondary | ICD-10-CM

## 2011-04-17 LAB — POCT URINALYSIS DIPSTICK
Bilirubin, UA: NEGATIVE
Blood, UA: NEGATIVE
Glucose, UA: NEGATIVE
Nitrite, UA: NEGATIVE
Spec Grav, UA: 1.015

## 2011-04-17 LAB — CBC WITH DIFFERENTIAL/PLATELET
Basophils Absolute: 0 10*3/uL (ref 0.0–0.1)
Eosinophils Absolute: 0.2 10*3/uL (ref 0.0–0.7)
HCT: 29.1 % — ABNORMAL LOW (ref 39.0–52.0)
Hemoglobin: 9.9 g/dL — ABNORMAL LOW (ref 13.0–17.0)
Lymphocytes Relative: 26.5 % (ref 12.0–46.0)
Lymphs Abs: 1.7 10*3/uL (ref 0.7–4.0)
MCHC: 34 g/dL (ref 30.0–36.0)
MCV: 92.4 fl (ref 78.0–100.0)
Monocytes Absolute: 0.5 10*3/uL (ref 0.1–1.0)
Neutro Abs: 4 10*3/uL (ref 1.4–7.7)
RDW: 13.4 % (ref 11.5–14.6)

## 2011-04-17 LAB — HEPATIC FUNCTION PANEL
ALT: 11 U/L (ref 0–53)
AST: 14 U/L (ref 0–37)
Bilirubin, Direct: 0.1 mg/dL (ref 0.0–0.3)
Total Bilirubin: 0.6 mg/dL (ref 0.3–1.2)

## 2011-04-17 LAB — BASIC METABOLIC PANEL
BUN: 26 mg/dL — ABNORMAL HIGH (ref 6–23)
CO2: 27 mEq/L (ref 19–32)
Calcium: 9.3 mg/dL (ref 8.4–10.5)
Chloride: 110 mEq/L (ref 96–112)
Creatinine, Ser: 2.3 mg/dL — ABNORMAL HIGH (ref 0.4–1.5)
GFR: 30.16 mL/min — ABNORMAL LOW (ref >60.00)
Glucose, Bld: 92 mg/dL (ref 70–99)
Potassium: 4.3 mEq/L (ref 3.5–5.1)
Sodium: 142 mEq/L (ref 135–145)

## 2011-04-17 LAB — PSA: PSA: 4.96 ng/mL — ABNORMAL HIGH (ref 0.10–4.00)

## 2011-04-17 LAB — TSH: TSH: 1.21 u[IU]/mL (ref 0.35–5.50)

## 2011-04-17 LAB — LIPID PANEL
Cholesterol: 165 mg/dL (ref 0–200)
HDL: 41.3 mg/dL (ref >39.00)
LDL Cholesterol: 101 mg/dL — ABNORMAL HIGH (ref 0–99)
Total CHOL/HDL Ratio: 4
Triglycerides: 114 mg/dL (ref 0.0–149.0)
VLDL: 22.8 mg/dL (ref 0.0–40.0)

## 2011-04-20 ENCOUNTER — Encounter: Payer: Self-pay | Admitting: Family Medicine

## 2011-04-25 ENCOUNTER — Encounter: Payer: Self-pay | Admitting: Family Medicine

## 2011-04-25 ENCOUNTER — Ambulatory Visit (INDEPENDENT_AMBULATORY_CARE_PROVIDER_SITE_OTHER): Payer: BC Managed Care – PPO | Admitting: Family Medicine

## 2011-04-25 VITALS — BP 118/60 | HR 97 | Temp 98.1°F | Ht 69.0 in | Wt 209.0 lb

## 2011-04-25 DIAGNOSIS — D649 Anemia, unspecified: Secondary | ICD-10-CM

## 2011-04-25 DIAGNOSIS — F341 Dysthymic disorder: Secondary | ICD-10-CM

## 2011-04-25 DIAGNOSIS — E559 Vitamin D deficiency, unspecified: Secondary | ICD-10-CM

## 2011-04-25 DIAGNOSIS — E785 Hyperlipidemia, unspecified: Secondary | ICD-10-CM

## 2011-04-25 DIAGNOSIS — R972 Elevated prostate specific antigen [PSA]: Secondary | ICD-10-CM

## 2011-04-25 DIAGNOSIS — N19 Unspecified kidney failure: Secondary | ICD-10-CM

## 2011-04-25 DIAGNOSIS — M1990 Unspecified osteoarthritis, unspecified site: Secondary | ICD-10-CM

## 2011-04-25 DIAGNOSIS — F419 Anxiety disorder, unspecified: Secondary | ICD-10-CM

## 2011-04-25 DIAGNOSIS — I10 Essential (primary) hypertension: Secondary | ICD-10-CM

## 2011-04-25 DIAGNOSIS — M129 Arthropathy, unspecified: Secondary | ICD-10-CM

## 2011-04-26 LAB — VITAMIN D 25 HYDROXY (VIT D DEFICIENCY, FRACTURES): Vit D, 25-Hydroxy: 29 ng/mL — ABNORMAL LOW (ref 30–89)

## 2011-04-28 ENCOUNTER — Other Ambulatory Visit: Payer: Self-pay | Admitting: Family Medicine

## 2011-04-28 MED ORDER — VITAMIN D3 1.25 MG (50000 UT) PO CAPS: 1.0000 | ORAL_CAPSULE | ORAL | Status: DC

## 2011-04-30 ENCOUNTER — Telehealth: Payer: Self-pay

## 2011-04-30 ENCOUNTER — Encounter: Payer: Self-pay | Admitting: Family Medicine

## 2011-04-30 MED ORDER — VITAMIN D (ERGOCALCIFEROL) 1.25 MG (50000 UNIT) PO CAPS: 50000.0000 [IU] | ORAL_CAPSULE | ORAL | Status: DC

## 2011-04-30 NOTE — Telephone Encounter (Signed)
Called to give pt lab results; left a message for pt to return call

## 2011-04-30 NOTE — Patient Instructions (Signed)
My impression in general you're doing very well in regard to the medical problems that you have Renal failure studies have improved Hypertension, hyperlipidemia under control Severe arthritis right knee continue to see Dr. Darrelyn Hillock I have refilled  Medications Of the renal studies and hemoglobin the other lab studies are good

## 2011-04-30 NOTE — Progress Notes (Signed)
  Subjective:    Patient ID: Philip Shields, male    DOB: April 29, 1933, 77 y.o.   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 Rosanne Ashing Deterding, nephrologist and has improved there is a primary problem at this time is that of pain in his right knee for which he has seen an orthopedist and the knee was injected with steroids with little help and he is to see Dr. Mliss Sax in approximately 2 weeksHPI continues to have chronic back pain elevated PSA and is under the care of her urologist, he has considerable stress within his family involving his 2 sions his hypertension his controlled he has past history of poor quadriceps bilaterally with surgery with good result. His right knee is giving him considerable problem and he is very much concerned that it will be improved enough for him to take a trip to [place] as a plan in 10 week No other complaints on review of systems  Review of Systems See history of present illness    Objective:   Physical Exam the patient is well-developed well-nourished slightly overweight white male who is in no acute distress but appears depressed HEENT reveals slight nasal congestion boggy pale mucosa with clear drainage her right pulses are good thyroid is normal in size Lungs clear to palpation percussion and auscultation Heart no cardiomegaly heart sounds without murmurs rhythm regular Abdomen liver spleen kidneys are nonpalpable Genitorectal exam done by urologist Extremities well-healed quadriceps correspondence legs right knee is swollen and tender and painful on flexion peripheral pulses are good He has no abnormalities noted       Assessment & Plan:  Hypertension controlled continue same medication Renal failure continue in the care of nephrologist in this or to any NSAIDs Anxiety depression to continue taking the  alprazolam as needed Arthritis severe of the knee as well as degenerative disc disease the back to utilize hydrocodone acetaminophen as needed Elevated PSA and BPH continue care of urologist Insomnia we'll prescribe Ambien CR   At bedtime as needed

## 2011-05-13 ENCOUNTER — Other Ambulatory Visit: Payer: Self-pay | Admitting: Family Medicine

## 2011-05-16 ENCOUNTER — Other Ambulatory Visit: Payer: Self-pay

## 2011-05-16 ENCOUNTER — Other Ambulatory Visit: Payer: Self-pay | Admitting: Family Medicine

## 2011-06-23 ENCOUNTER — Other Ambulatory Visit: Payer: Self-pay | Admitting: Family Medicine

## 2011-06-27 ENCOUNTER — Other Ambulatory Visit: Payer: Self-pay

## 2011-06-27 MED ORDER — LEVOCETIRIZINE DIHYDROCHLORIDE 5 MG PO TABS: 5.0000 mg | ORAL_TABLET | Freq: Every evening | ORAL | Status: DC

## 2011-06-27 NOTE — Telephone Encounter (Signed)
rx sent to pharmacy for xyzal 5 mg qd. Ok per Dr. Scotty Court.

## 2011-07-02 ENCOUNTER — Other Ambulatory Visit: Payer: Self-pay | Admitting: Family Medicine

## 2011-07-04 ENCOUNTER — Other Ambulatory Visit: Payer: Self-pay

## 2011-07-04 ENCOUNTER — Other Ambulatory Visit (INDEPENDENT_AMBULATORY_CARE_PROVIDER_SITE_OTHER): Payer: BC Managed Care – PPO

## 2011-07-04 DIAGNOSIS — D649 Anemia, unspecified: Secondary | ICD-10-CM

## 2011-07-04 DIAGNOSIS — R799 Abnormal finding of blood chemistry, unspecified: Secondary | ICD-10-CM

## 2011-07-04 MED ORDER — LEVOCETIRIZINE DIHYDROCHLORIDE 5 MG PO TABS: 5.0000 mg | ORAL_TABLET | Freq: Every evening | ORAL | Status: AC

## 2011-07-04 MED ORDER — OXYCODONE HCL 20 MG PO TABS: ORAL_TABLET | ORAL | Status: DC

## 2011-07-04 MED ORDER — VALACYCLOVIR HCL 1 G PO TABS: 1000.0000 mg | ORAL_TABLET | Freq: Two times a day (BID) | ORAL | Status: DC

## 2011-07-04 MED ORDER — AZITHROMYCIN 250 MG PO TABS: ORAL_TABLET | ORAL | Status: AC

## 2011-07-04 MED ORDER — CIPROFLOXACIN HCL 500 MG PO TABS: 500.0000 mg | ORAL_TABLET | Freq: Two times a day (BID) | ORAL | Status: AC

## 2011-07-06 ENCOUNTER — Other Ambulatory Visit: Payer: Self-pay | Admitting: Family Medicine

## 2011-07-07 ENCOUNTER — Other Ambulatory Visit: Payer: Self-pay | Admitting: Family Medicine

## 2011-07-19 ENCOUNTER — Other Ambulatory Visit: Payer: Self-pay

## 2011-07-19 MED ORDER — OLOPATADINE HCL 0.2 % OP SOLN: 1.0000 [drp] | Freq: Every day | OPHTHALMIC | Status: DC

## 2011-07-19 MED ORDER — TRIAMCINOLONE ACETONIDE 0.1 % EX CREA: TOPICAL_CREAM | Freq: Two times a day (BID) | CUTANEOUS | Status: DC

## 2011-07-23 LAB — COMPREHENSIVE METABOLIC PANEL
ALT: 11
AST: 28
Albumin: 3.9
CO2: 25
Chloride: 110
Creatinine, Ser: 2.23 — ABNORMAL HIGH
GFR calc Af Amer: 35 — ABNORMAL LOW
GFR calc non Af Amer: 29 — ABNORMAL LOW
Potassium: 4.9
Sodium: 140
Total Bilirubin: 1

## 2011-07-23 LAB — CBC
MCV: 92.1
Platelets: 236
RBC: 3.95 — ABNORMAL LOW
WBC: 7.7

## 2011-07-23 LAB — DIFFERENTIAL
Basophils Absolute: 0
Eosinophils Absolute: 0.2
Eosinophils Relative: 2
Lymphocytes Relative: 17
Monocytes Absolute: 0.6

## 2011-07-23 LAB — POCT CARDIAC MARKERS
Operator id: 277751
Troponin i, poc: <0.05

## 2011-08-07 LAB — COMPREHENSIVE METABOLIC PANEL
Albumin: 3.6
Alkaline Phosphatase: 66
BUN: 28 — ABNORMAL HIGH
Calcium: 9
Creatinine, Ser: 2.29 — ABNORMAL HIGH
Glucose, Bld: 125 — ABNORMAL HIGH
Potassium: 4.1
Total Protein: 6.6

## 2011-08-07 LAB — POCT CARDIAC MARKERS
CKMB, poc: 1.1
Myoglobin, poc: 167
Operator id: 272551
Troponin i, poc: <0.05

## 2011-08-07 LAB — DIFFERENTIAL
Basophils Relative: 0
Lymphocytes Relative: 14
Lymphs Abs: 1.1
Monocytes Absolute: 0.7
Monocytes Relative: 9
Neutro Abs: 6.2
Neutrophils Relative %: 76

## 2011-08-07 LAB — CBC
HCT: 35.4 — ABNORMAL LOW
Hemoglobin: 12 — ABNORMAL LOW
MCHC: 33.8
Platelets: 252
RDW: 12.8

## 2011-08-10 ENCOUNTER — Other Ambulatory Visit: Payer: Self-pay | Admitting: Family Medicine

## 2011-09-06 ENCOUNTER — Other Ambulatory Visit: Payer: Self-pay | Admitting: Family Medicine

## 2011-09-12 NOTE — Telephone Encounter (Signed)
Pt last seen 04/25/11. Pls advise.  

## 2011-09-13 NOTE — Telephone Encounter (Signed)
ok 

## 2011-09-15 ENCOUNTER — Other Ambulatory Visit: Payer: Self-pay | Admitting: Family Medicine

## 2011-09-17 NOTE — Telephone Encounter (Signed)
ok 

## 2011-09-17 NOTE — Telephone Encounter (Signed)
Pt last seen 04/25/11. Pls advise.  

## 2011-11-06 ENCOUNTER — Other Ambulatory Visit: Payer: Self-pay | Admitting: Internal Medicine

## 2011-11-07 ENCOUNTER — Telehealth: Payer: Self-pay | Admitting: *Deleted

## 2011-11-07 ENCOUNTER — Encounter: Payer: Self-pay | Admitting: Family Medicine

## 2011-11-07 NOTE — Telephone Encounter (Signed)
Received a fax from pt pharmacy [facility]) asking "would you please ask Dr Caryl Never if he would take me as a patient".

## 2011-11-07 NOTE — Progress Notes (Signed)
  Subjective:    Patient ID: Philip Shields, male    DOB: 1933-08-19, 78 y.o.   MRN: [id number]  HPI    Review of Systems     Objective:   Physical Exam        Assessment & Plan:

## 2011-11-08 NOTE — Telephone Encounter (Signed)
We have taken our full quota of Medicare patients at this time.

## 2011-11-08 NOTE — Telephone Encounter (Signed)
Philip Shields, would you be so kind as to call this pt with Dr Lucie Leather response.

## 2011-11-26 ENCOUNTER — Telehealth: Payer: Self-pay | Admitting: Family Medicine

## 2011-11-26 ENCOUNTER — Encounter: Payer: Self-pay | Admitting: Family Medicine

## 2011-11-26 ENCOUNTER — Ambulatory Visit (INDEPENDENT_AMBULATORY_CARE_PROVIDER_SITE_OTHER): Payer: BC Managed Care – PPO | Admitting: Family Medicine

## 2011-11-26 DIAGNOSIS — R972 Elevated prostate specific antigen [PSA]: Secondary | ICD-10-CM

## 2011-11-26 DIAGNOSIS — G8929 Other chronic pain: Secondary | ICD-10-CM | POA: Insufficient documentation

## 2011-11-26 DIAGNOSIS — N19 Unspecified kidney failure: Secondary | ICD-10-CM

## 2011-11-26 DIAGNOSIS — I10 Essential (primary) hypertension: Secondary | ICD-10-CM

## 2011-11-26 DIAGNOSIS — M25569 Pain in unspecified knee: Secondary | ICD-10-CM

## 2011-11-26 DIAGNOSIS — E785 Hyperlipidemia, unspecified: Secondary | ICD-10-CM

## 2011-11-26 MED ORDER — LOSARTAN POTASSIUM 50 MG PO TABS: 50.0000 mg | ORAL_TABLET | Freq: Every day | ORAL | Status: DC

## 2011-11-26 MED ORDER — SIMVASTATIN 40 MG PO TABS: 40.0000 mg | ORAL_TABLET | Freq: Every day | ORAL | Status: DC

## 2011-11-26 NOTE — Assessment & Plan Note (Signed)
R>>L.  Has had 3 steroid injections, most recent one was 06/2011. Takes a reasonable amount of vicodin daily for pain. He'll continue f/u with Dr. Darrelyn Hillock, his orthopedist. Avoid NSAIDs due to his CRI.

## 2011-11-26 NOTE — Assessment & Plan Note (Signed)
+  BPH.   He is due for an annual check-in with Dr. Vernie Ammons at [facility].  He will contact their office for f/u appt.

## 2011-11-26 NOTE — Progress Notes (Signed)
OFFICE VISIT  11/26/2011   CC:  Chief Complaint  Patient presents with  . Establish Care    transfer from Dr. Scotty Court, needs refills     HPI:    Patient is a 78 y.o. Caucasian male who presents for initial visit with me (transferring from Dr. Scotty Court, who retired recently).  He has no acute complaints and is UTD on all vaccines. He gets labs at f/u visits with Dr. Darrick Penna, his nephrologist, q32mo (most recent 07/2011) but these don't include lipids per pt report today.    Past Medical History  Diagnosis Date  . Arthritis     Knees and low back  . Anxiety   . GERD (gastroesophageal reflux disease)   . Hyperlipidemia   . Anemia   . Chronic kidney disease   . BPH with elevated PSA   . Hypertension   . Colon polyp 2005    Not sent to path; repeat recommended 5 yrs, so repeat colonoscopy 03/2010 was normal  . Palpitations     Event monitor/work-up with Dr. Allyson Sabal: no meds recommended.    Past Surgical History  Procedure Date  . Transthoracic echocardiogram 11/2010    Concentric LVH, diastolic dysfunction.  EF normal.  Mild mitral regurg.  . Acromioplasty 04/03/02    Left, with repair of complete rotator cuff repair  . Quadriceps tendon repair 2002    Both knees, right most recent  . Lumbar laminectomy 2001    L4  . Knee surgery     Bilateral    Outpatient Prescriptions Prior to Visit  Medication Sig Dispense Refill  . ALPRAZolam (XANAX) 0.5 MG tablet take 1 tablet by mouth four times a day  120 tablet  0  . aspirin 81 MG tablet Take 81 mg by mouth daily.        . cyclobenzaprine (FLEXERIL) 10 MG tablet take 1 tablet by mouth every morning ,MID AFTERNOON and at bedtime if needed for muscle spasm  90 tablet  5  . diphenoxylate-atropine (LOMOTIL) 2.5-0.025 MG per tablet take 1 or 2 tablets by mouth four times a day if needed to PREVENT diarrhea  100 tablet  5  . fluticasone (FLONASE) 50 MCG/ACT nasal spray instill 2 sprays into each nostril every morning  16 g  11  .  HYDROcodone-acetaminophen (LORTAB) 10-500 MG per tablet take 1 tablet by mouth every 4 hours if needed for pain  100 tablet  5  . iron polysaccharides (NIFEREX) 150 MG capsule Take 150 mg by mouth 2 (two) times daily.        Marland Kitchen ketoconazole (NIZORAL) 2 % shampoo shampoo as directed  240 mL  11  . levocetirizine (XYZAL) 5 MG tablet Take 1 tablet (5 mg total) by mouth every evening.  30 tablet  11  . Olopatadine HCl 0.2 % SOLN Apply 1 drop to eye daily.  2.5 mL  5  . omeprazole (PRILOSEC) 20 MG capsule take 1 capsule by mouth twice a day  180 capsule  3  . Tamsulosin HCl (FLOMAX) 0.4 MG CAPS take 1 capsule by mouth at bedtime  90 capsule  3  . zolpidem (AMBIEN) 10 MG tablet take 1 tablet by mouth at bedtime if needed for sleep  30 tablet  5  . losartan (COZAAR) 100 MG tablet TAKE 1 TABLET BY MOUTH ONCE DAILY  90 tablet  1  . simvastatin (ZOCOR) 40 MG tablet Take 1 tablet (40 mg total) by mouth at bedtime.  90 tablet  0  .  oxyCODONE-acetaminophen (PERCOCET) 10-325 MG per tablet Take 1 tablet by mouth every 4 (four) hours as needed.        . triamcinolone (KENALOG) 0.1 % cream Apply topically 2 (two) times daily.  80 g  5  . valACYclovir (VALTREX) 1000 MG tablet Take 1 tablet (1,000 mg total) by mouth 2 (two) times daily. 2 stat onset lesion repeat in 12 hours  21 tablet  0  . ALPRAZolam (XANAX) 0.25 MG tablet Take 0.25 mg by mouth 3 (three) times daily as needed.        . Cholecalciferol (VITAMIN D3) 50000 UNITS CAPS Take 1 capsule by mouth once a week. Weekly x12 weeks  12 capsule  2  . fexofenadine (ALLEGRA) 180 MG tablet Take 180 mg by mouth daily.        Marland Kitchen levocetirizine (XYZAL) 5 MG tablet Take 1 tablet (5 mg total) by mouth every evening.  90 tablet  3  . omeprazole (PRILOSEC) 20 MG capsule take 1 capsule by mouth twice a day for GERD  180 capsule  3  . simvastatin (ZOCOR) 40 MG tablet take 1 tablet by mouth once daily at bedtime  90 tablet  3  . Vitamin D, Ergocalciferol, (DRISDOL) 50000 UNITS  CAPS Take 1 capsule (50,000 Units total) by mouth every 7 (seven) days.  12 capsule  0  . zolpidem (AMBIEN CR) 12.5 MG CR tablet take 1 tablet by mouth at bedtime if needed for sleep  30 tablet  5    No Known Allergies  ROS As per HPI  PE: Blood pressure 136/74, pulse 79, temperature 98 F (36.7 C), temperature source Temporal, height 5\' 9"  (1.753 m), weight 210 lb (95.255 kg), SpO2 96.00%. Gen: Alert, well appearing.  Patient is oriented to person, place, time, and situation. ENT:  Eyes: no injection, icteris, swelling, or exudate.  EOMI, PERRLA. Nose: no drainage or turbinate edema/swelling.  No injection or focal lesion.  Mouth: lips without lesion/swelling.  Oral mucosa pink and moist.  Dentition intact and without obvious caries or gingival swelling.  Oropharynx without erythema, exudate, or swelling.  Neck - No masses or thyromegaly or limitation in range of motion CV: RRR, no m/r/g.   LUNGS: CTA bilat, nonlabored resps, good aeration in all lung fields. ABD: soft, NT, ND, BS normal.  No hepatospenomegaly or mass.  No bruits. EXT: no clubbing, cyanosis, or edema.   LABS:  None today  IMPRESSION AND PLAN:  HYPERTENSION Problem stable.  Continue current medications and diet appropriate for this condition.  We have reviewed our general long term plan for this problem and also reviewed symptoms and signs that should prompt the patient to call or return to the office.   HYPERLIPIDEMIA Lab Results  Component Value Date   CHOL 165 04/17/2011   HDL 41.30 04/17/2011   LDLCALC 101* 04/17/2011   LDLDIRECT 139.0 04/21/2010   TRIG 114.0 04/17/2011   CHOLHDL 4 04/17/2011   Will renew zocor rx today and repeat fasting lipids at next f/u in 3-4 mo.  ELEVATED PROSTATE SPECIFIC ANTIGEN +BPH.   He is due for an annual check-in with Dr. Vernie Ammons at [facility].  He will contact their office for f/u appt.  KIDNEY FAILURE Stage III/IV, apparently stable per pt report. Continue routine  f/u with Dr. Darrick Penna and will request most recent office f/u note from their office.  Chronic knee pain R>>L.  Has had 3 steroid injections, most recent one was 06/2011. Takes a reasonable amount of  vicodin daily for pain. He'll continue f/u with Dr. Darrelyn Hillock, his orthopedist. Avoid NSAIDs due to his CRI.   Hx of low vit D: recheck vit D level at next f/u visit unless nephrology has already done this.  FOLLOW UP: Return in about 3 months (around 02/24/2012) for fasting exam and lab work.

## 2011-11-26 NOTE — Telephone Encounter (Signed)
Pls request records from Dr. Darrick Penna from after 05/06/11 at [facility] in [place] and from Dr. Darrelyn Hillock (all) -ortho-in [place].  Thx.

## 2011-11-26 NOTE — Assessment & Plan Note (Signed)
Problem stable.  Continue current medications and diet appropriate for this condition.  We have reviewed our general long term plan for this problem and also reviewed symptoms and signs that should prompt the patient to call or return to the office.  

## 2011-11-26 NOTE — Telephone Encounter (Signed)
Faxed 11/26/11

## 2011-11-26 NOTE — Assessment & Plan Note (Signed)
Lab Results  Component Value Date   CHOL 165 04/17/2011   HDL 41.30 04/17/2011   LDLCALC 101* 04/17/2011   LDLDIRECT 139.0 04/21/2010   TRIG 114.0 04/17/2011   CHOLHDL 4 04/17/2011   Will renew zocor rx today and repeat fasting lipids at next f/u in 3-4 mo.

## 2011-11-26 NOTE — Assessment & Plan Note (Signed)
Stage III/IV, apparently stable per pt report. Continue routine f/u with Dr. Darrick Penna and will request most recent office f/u note from their office.

## 2011-12-09 ENCOUNTER — Other Ambulatory Visit: Payer: Self-pay | Admitting: Family Medicine

## 2011-12-10 ENCOUNTER — Other Ambulatory Visit: Payer: Self-pay | Admitting: *Deleted

## 2011-12-10 NOTE — Telephone Encounter (Signed)
RX filled earlier today.  Encounter closed.

## 2012-01-10 ENCOUNTER — Other Ambulatory Visit: Payer: Self-pay | Admitting: Internal Medicine

## 2012-01-11 NOTE — Telephone Encounter (Signed)
Dr. Arliss Journey pt.

## 2012-02-21 ENCOUNTER — Other Ambulatory Visit: Payer: Self-pay | Admitting: Family Medicine

## 2012-03-10 ENCOUNTER — Other Ambulatory Visit: Payer: Self-pay | Admitting: Family Medicine

## 2012-06-04 ENCOUNTER — Other Ambulatory Visit: Payer: Self-pay | Admitting: Family Medicine

## 2012-06-04 ENCOUNTER — Other Ambulatory Visit: Payer: Self-pay | Admitting: *Deleted

## 2012-06-04 MED ORDER — OMEPRAZOLE 20 MG PO CPDR: 20.0000 mg | DELAYED_RELEASE_CAPSULE | Freq: Two times a day (BID) | ORAL | Status: DC

## 2012-06-04 NOTE — Telephone Encounter (Signed)
Faxed refill request received from pharmacy for Omeprazole  Last filled by MD on 07/06/11 by Dr.Stafford office Last seen on 11/26/11 Follow up needed.  RX sent.

## 2012-06-04 NOTE — Telephone Encounter (Signed)
eScribe request for refill on Flomax Last filled -  Last seen on - 11/26/11 Follow up needed.   RX sent with note appt needed.

## 2012-06-07 ENCOUNTER — Other Ambulatory Visit: Payer: Self-pay | Admitting: Internal Medicine

## 2012-06-09 NOTE — Telephone Encounter (Signed)
I need to see the patient for routine f/u prior to RF of this med (I haven't seen him since 10/2011). If he is totally out, I can rx him a few to get him to his appt w/me (must be scheduled), but he needs to be notified that due to a new rule I can only rx a max dose of 5mg  of zolpidem for anyone over the age of 22 yrs. --thx

## 2012-06-09 NOTE — Telephone Encounter (Signed)
Dr. Milinda Cave pt

## 2012-06-10 ENCOUNTER — Telehealth: Payer: Self-pay | Admitting: *Deleted

## 2012-06-10 NOTE — Telephone Encounter (Signed)
Called and left a message for the patient that Dr. Milinda Cave is requesing he make a follow up appointment since he hasn't been seen since Jan 2013.  I advised if he is totally out, Dr. Milinda Cave will prescribe a few tablets to get him through until his appointment. Also due to a new rule, the doctor can only prescribe a max dose of 5 mg of Zolpidem for anyone over the age of 42 yrs.

## 2012-06-11 ENCOUNTER — Telehealth: Payer: Self-pay | Admitting: *Deleted

## 2012-06-11 NOTE — Telephone Encounter (Signed)
LM for patient again to advise he needs to make appointment to see Dr. Milinda Cave for a routine follow up before refilling the Zolpidem.

## 2012-06-13 ENCOUNTER — Other Ambulatory Visit: Payer: Self-pay

## 2012-06-13 NOTE — Telephone Encounter (Signed)
Pt needs to have f/u appt before any RF.  Lowry Ram already left him a message saying this on the 14th.  -thx

## 2012-06-13 NOTE — Telephone Encounter (Signed)
Please advise refill of Zolpidem? Last RX wrote on 09-15-11 quantity 30 with 5 refills from Dr Amador Cunas.  Pt came to Dr Milinda Cave on 11-26-11 for new patient and was asked to return for exam and fasting labs in 3 months?  If ok to refill send RX to [phone]

## 2012-06-16 NOTE — Telephone Encounter (Signed)
I asked pts spouse to have pt return my call and she stated pt has an appt on Thurs.

## 2012-06-19 ENCOUNTER — Encounter: Payer: Self-pay | Admitting: Family Medicine

## 2012-06-19 ENCOUNTER — Ambulatory Visit (INDEPENDENT_AMBULATORY_CARE_PROVIDER_SITE_OTHER): Payer: BC Managed Care – PPO | Admitting: Family Medicine

## 2012-06-19 VITALS — BP 115/70 | HR 74 | Ht 69.0 in | Wt 206.0 lb

## 2012-06-19 DIAGNOSIS — E785 Hyperlipidemia, unspecified: Secondary | ICD-10-CM

## 2012-06-19 DIAGNOSIS — I10 Essential (primary) hypertension: Secondary | ICD-10-CM

## 2012-06-19 DIAGNOSIS — N19 Unspecified kidney failure: Secondary | ICD-10-CM

## 2012-06-19 DIAGNOSIS — M129 Arthropathy, unspecified: Secondary | ICD-10-CM

## 2012-06-19 LAB — HEPATIC FUNCTION PANEL
AST: 17 U/L (ref 0–37)
Albumin: 3.8 g/dL (ref 3.5–5.2)
Alkaline Phosphatase: 56 U/L (ref 39–117)
Bilirubin, Direct: 0 mg/dL (ref 0.0–0.3)
Total Protein: 6.7 g/dL (ref 6.0–8.3)

## 2012-06-19 LAB — LIPID PANEL
Cholesterol: 174 mg/dL (ref 0–200)
LDL Cholesterol: 105 mg/dL — ABNORMAL HIGH (ref 0–99)
Triglycerides: 116 mg/dL (ref 0.0–149.0)

## 2012-06-19 MED ORDER — TRIAMCINOLONE ACETONIDE 0.1 % EX CREA: TOPICAL_CREAM | Freq: Two times a day (BID) | CUTANEOUS | Status: AC

## 2012-06-19 MED ORDER — HYDROCODONE-ACETAMINOPHEN 10-325 MG PO TABS: ORAL_TABLET | ORAL | Status: DC

## 2012-06-19 MED ORDER — FLUTICASONE PROPIONATE 50 MCG/ACT NA SUSP: NASAL | Status: DC

## 2012-06-19 MED ORDER — KETOCONAZOLE 2 % EX SHAM: MEDICATED_SHAMPOO | CUTANEOUS | Status: DC

## 2012-06-25 ENCOUNTER — Telehealth: Payer: Self-pay | Admitting: Family Medicine

## 2012-06-25 NOTE — Assessment & Plan Note (Signed)
Problem stable.  Continue current medications and diet appropriate for this condition.  We have reviewed our general long term plan for this problem and also reviewed symptoms and signs that should prompt the patient to call or return to the office.  

## 2012-06-25 NOTE — Progress Notes (Signed)
OFFICE NOTE  06/25/2012  CC:  Chief Complaint  Patient presents with  . Follow-up    HTN, needs med refills prior to retiring     HPI: Patient is a 78 y.o. Caucasian male who is here for 7 month routine chronic illness f/u. He saw renal MD 05/2012 and pt reports everything was stable, was told he could d/c iron.   He has had appropriate urologic f/u and there are plans for appropriate routine f/u.  He has chronic LBP and knee pain.  Last injection of steroid into knee (right) was 03/2012.  Takes 1/2 of a 10/500 Vicodin a few times a day typically.    Says dermatologist removed a tick with a curette from his left flank/back recently.  It had been present about 1 mo and pt thought it was a skin lesion.  He denies fevers, malaise, myalgias, HAs, or rash.   He does request RF of his vicodin today.  He must avoid NSAIDs for pain b/c of his CRI.  Overall he reports feeling stable.  However, he does plan on retiring Scientist, research (physical sciences)) in about 1 month.  Pertinent PMH:  Past Medical History  Diagnosis Date  . Arthritis     Knees and low back  . Anxiety   . GERD (gastroesophageal reflux disease)   . Hyperlipidemia   . Anemia   . Chronic kidney disease     Secondary to HTN, chronic NSAIDs (excessive kidney scarring noted)--Dr. Deterding.  Marland Kitchen BPH with elevated PSA   . Hypertension   . Colon polyp 2005    Not sent to path; repeat recommended 5 yrs, so repeat colonoscopy 03/2010 was normal  . Palpitations     Worked up by Dr. Allyson Sabal (myoview neg, echo normal, 52mo event monitor unremarkable: no new  meds were recommended.    MEDS:  Outpatient Prescriptions Prior to Visit  Medication Sig Dispense Refill  . ALPRAZolam (XANAX) 0.5 MG tablet take 1 tablet by mouth four times a day if needed  120 tablet  2  . aspirin 81 MG tablet Take 81 mg by mouth daily.        . calcitRIOL (ROCALTROL) 0.25 MCG capsule Take 1 capsule by mouth Every morning.      . cyclobenzaprine (FLEXERIL) 10 MG tablet take 1  tablet by mouth every morning ,MID AFTERNOON and at bedtime if needed for muscle spasm  90 tablet  5  . diphenoxylate-atropine (LOMOTIL) 2.5-0.025 MG per tablet take 1 or 2 tablets by mouth four times a day if needed to PREVENT diarrhea  100 tablet  5  . levocetirizine (XYZAL) 5 MG tablet Take 1 tablet (5 mg total) by mouth every evening.  30 tablet  11  . losartan (COZAAR) 50 MG tablet Take 1 tablet (50 mg total) by mouth daily.  90 tablet  3  . Olopatadine HCl 0.2 % SOLN Apply 1 drop to eye daily.  2.5 mL  5  . omeprazole (PRILOSEC) 20 MG capsule Take 1 capsule (20 mg total) by mouth 2 (two) times daily.  180 capsule  3  . simvastatin (ZOCOR) 40 MG tablet take 1 tablet by mouth at bedtime  90 tablet  1  . Tamsulosin HCl (FLOMAX) 0.4 MG CAPS Take 1 capsule (0.4 mg total) by mouth at bedtime. Appt needed for more refills.  90 capsule  0  . valACYclovir (VALTREX) 1000 MG tablet TAKE 2 TABLETS BY MOUTH IMMEDIATELY AT ONSET OF LESION. REPEAT IN 12 HOURS  30 tablet  1  . fluticasone (FLONASE) 50 MCG/ACT nasal spray instill 2 sprays into each nostril every morning  16 g  11  . HYDROcodone-acetaminophen (LORTAB) 10-500 MG per tablet take 1 tablet by mouth every 4 hours if needed for pain  100 tablet  5  . ketoconazole (NIZORAL) 2 % shampoo shampoo as directed  240 mL  11  . triamcinolone (KENALOG) 0.1 % cream Apply topically 2 (two) times daily.  80 g  5  . iron polysaccharides (NIFEREX) 150 MG capsule Take 150 mg by mouth 2 (two) times daily.        Marland Kitchen oxyCODONE-acetaminophen (PERCOCET) 10-325 MG per tablet Take 1 tablet by mouth every 4 (four) hours as needed.        . zolpidem (AMBIEN) 10 MG tablet take 1 tablet by mouth at bedtime if needed for sleep  30 tablet  5  Of note**: he is NOT taking the Percocet, zolpidem, or niferex listed above.  He takes temazepam 15mg , 1-2 qhs.  PE: Blood pressure 115/70, pulse 74, height 5\' 9"  (1.753 m), weight 206 lb (93.441 kg). Gen: Alert, well appearing.  Patient  is oriented to person, place, time, and situation. Neck - No masses or thyromegaly or limitation in range of motion CV: RRR, no m/r/g.   LUNGS: CTA bilat, nonlabored resps, good aeration in all lung fields.  IMPRESSION AND PLAN:  ARTHRITIS Back and knees: stable.  RF'd vicodin today. He may consider another steroid injection in right knee in 4-6 weeks.  HYPERTENSION Problem stable.  Continue current medications and diet appropriate for this condition.  We have reviewed our general long term plan for this problem and also reviewed symptoms and signs that should prompt the patient to call or return to the office.   HYPERLIPIDEMIA Stable.  Check FLP and hepatic panel.  KIDNEY FAILURE Stable.  Keep routine nephrologist f/u. Will obtain most recent office note/labs from Dr. Darrick Penna.     FOLLOW UP:  6 mo

## 2012-06-25 NOTE — Assessment & Plan Note (Signed)
Stable.  Keep routine nephrologist f/u. Will obtain most recent office note/labs from Dr. Darrick Penna.

## 2012-06-25 NOTE — Telephone Encounter (Signed)
Notified wife of results.  She will let [facility] know.

## 2012-06-25 NOTE — Assessment & Plan Note (Addendum)
Back and knees: stable.  RF'd vicodin today. He may consider another steroid injection in right knee in 4-6 weeks.

## 2012-06-25 NOTE — Assessment & Plan Note (Signed)
Stable.  Check FLP and hepatic panel.

## 2012-06-25 NOTE — Telephone Encounter (Signed)
Pls notify pt that his recent cholesterol panel and liver panel were good.  Continue current meds. -thx

## 2012-07-19 ENCOUNTER — Other Ambulatory Visit: Payer: Self-pay | Admitting: Family Medicine

## 2012-07-21 NOTE — Telephone Encounter (Signed)
eScribe request for refill on SIMVASTATIN Last filled -  Last seen on - 06/19/12 Follow up - 6 MONTHS. RX sent

## 2012-07-22 ENCOUNTER — Other Ambulatory Visit: Payer: Self-pay | Admitting: *Deleted

## 2012-07-22 MED ORDER — ALPRAZOLAM 0.5 MG PO TABS: ORAL_TABLET | ORAL | Status: DC

## 2012-07-22 NOTE — Telephone Encounter (Signed)
RX faxed

## 2012-07-22 NOTE — Telephone Encounter (Signed)
Faxed refill request received from pharmacy for ALPRAZOLAM  Last filled by MD on 01/11/12, #120 X 2 Last seen on 06/19/12 Follow up in 6 months, no appt in [facility] Please advise refills.

## 2012-08-15 ENCOUNTER — Other Ambulatory Visit: Payer: Self-pay | Admitting: Family Medicine

## 2012-08-15 NOTE — Telephone Encounter (Signed)
eScribe request for refill on FLOMAX Last filled - 06/04/12, #90 X 0 Last seen on - 06/19/12 Follow up - 6 MONTHS RX sent  eScribe request for refill on Norco Last filled - 06/19/12, #100 X 0   Please advise refill on Norco.

## 2012-08-18 NOTE — Telephone Encounter (Signed)
Faxed to pharmacy on 08/15/12.

## 2012-10-10 ENCOUNTER — Other Ambulatory Visit: Payer: Self-pay | Admitting: *Deleted

## 2012-10-10 MED ORDER — SIMVASTATIN 40 MG PO TABS: 40.0000 mg | ORAL_TABLET | Freq: Every day | ORAL | Status: DC

## 2012-10-10 NOTE — Telephone Encounter (Signed)
PC from patient requesting 90 day refill on simvastatin.  This is done.

## 2012-10-16 ENCOUNTER — Other Ambulatory Visit: Payer: Self-pay | Admitting: Family Medicine

## 2012-10-17 ENCOUNTER — Other Ambulatory Visit: Payer: Self-pay | Admitting: *Deleted

## 2012-10-17 MED ORDER — LOSARTAN POTASSIUM 50 MG PO TABS: 50.0000 mg | ORAL_TABLET | Freq: Every day | ORAL | Status: DC

## 2012-11-07 ENCOUNTER — Other Ambulatory Visit: Payer: Self-pay | Admitting: Family Medicine

## 2012-11-07 NOTE — Telephone Encounter (Signed)
RX faxed

## 2012-11-07 NOTE — Telephone Encounter (Signed)
eScribe request for refill on HYDROCODONE Last filled - 08/15/12, #100 X 0 Last seen on - 06/19/12 Follow up - 6 MONTHS Please advise refills.

## 2012-11-24 ENCOUNTER — Ambulatory Visit (INDEPENDENT_AMBULATORY_CARE_PROVIDER_SITE_OTHER): Payer: BC Managed Care – PPO | Admitting: Family Medicine

## 2012-11-24 ENCOUNTER — Other Ambulatory Visit: Payer: Self-pay | Admitting: Family Medicine

## 2012-11-24 ENCOUNTER — Encounter: Payer: Self-pay | Admitting: Family Medicine

## 2012-11-24 VITALS — BP 122/66 | HR 68 | Ht 69.0 in | Wt 205.0 lb

## 2012-11-24 DIAGNOSIS — R972 Elevated prostate specific antigen [PSA]: Secondary | ICD-10-CM

## 2012-11-24 DIAGNOSIS — M25569 Pain in unspecified knee: Secondary | ICD-10-CM

## 2012-11-24 DIAGNOSIS — N189 Chronic kidney disease, unspecified: Secondary | ICD-10-CM

## 2012-11-24 DIAGNOSIS — N400 Enlarged prostate without lower urinary tract symptoms: Secondary | ICD-10-CM

## 2012-11-24 DIAGNOSIS — J069 Acute upper respiratory infection, unspecified: Secondary | ICD-10-CM

## 2012-11-24 DIAGNOSIS — N19 Unspecified kidney failure: Secondary | ICD-10-CM

## 2012-11-24 DIAGNOSIS — I10 Essential (primary) hypertension: Secondary | ICD-10-CM

## 2012-11-24 MED ORDER — DICLOFENAC SODIUM 1 % TD GEL: 2.0000 g | Freq: Four times a day (QID) | TRANSDERMAL | Status: DC

## 2012-11-24 NOTE — Assessment & Plan Note (Addendum)
Problem stable.  Continue current medications and diet appropriate for this condition.  We have reviewed our general long term plan for this problem and also reviewed symptoms and signs that should prompt the patient to call or return to the office. He gets lytes/cr/Hb followed through his renal MD, Dr. Darrick Penna.

## 2012-11-24 NOTE — Assessment & Plan Note (Addendum)
Left.  Need to avoid oral NSAIDs due to CRI. Agreed to trial of voltaren gel, apply qid prn. Will ask patient to return for recheck of BUN/Cr after taking this regularly for a couple of weeks.

## 2012-11-24 NOTE — Assessment & Plan Note (Signed)
Self-limited nature of this illness was discussed, questions answered.  Discussed symptomatic care, rest, fluids.   Warning signs/symptoms of worsening illness were discussed.  Patient instructed to call or return if any of these occur.  

## 2012-11-24 NOTE — Assessment & Plan Note (Signed)
He has not seen his urologist in 18 mo or so.  He asks if I'll f/u his PSA today--ordered.

## 2012-11-24 NOTE — Assessment & Plan Note (Signed)
Stable.  Keep routine nephrologist f/u's. Will obtain most recent o/v and lab results from Dr. Deterding's office.

## 2012-11-24 NOTE — Progress Notes (Signed)
OFFICE NOTE  11/24/2012  CC:  Chief Complaint  Patient presents with  . Follow-up    HTN, CRI, cholesterol--also has head congestion and runny nose; has a list of questions     HPI: Patient is a 79 y.o. Caucasian male who is here for 6 mo f/u HTN, CRI, hyperlipidemia.   Has not seen urologist in last 18 mo, no PSA in this time.  Asks for me to check it today. Saw Dr. Darrick Penna about 3 wks ago and got labs, all was stable per pt.  Has hx of knee issues for which he gets periodic steroid injections in joint, most recently 09/2012.  He was given a small tube of voltaren and he finds it helps with some hand nuckle arthritis, hasn't used it enough on knee to know if it helps but hopes to try in the future.  Having problems getting to sleep, eventually goes to sleep and maintains sleep fine.  No OTC meds have been tried.  Benadryl and Remus Loffler make him too groggy.  Recent trial of temazepam over last 3-4 mo (15mg ) has not been that helpful.  Pt presents complaining of respiratory symptoms for 2-3 days.  Primary symptoms are: stuffy nose.  Worst symptoms seems to be the nasal congestion, not much mucous.  Lately the symptoms seem to be staying the same.  No coughing, no ST, no HA.  Pertinent negatives: No fevers, no wheezing, and no SOB.  No pain in face or teeth.  No significant HA.  ST mild at most.   Symptoms made worse by nothing.  Symptoms improved by nothing.  Nothing tried yet. Smoker? no Recent sick contact? no Muscle or joint aches? no Flu shot this season at least 2 wks ago? yes  Additional ROS: no n/v/d or abdominal pain.  No rash.  No neck stiffness.   +Mild fatigue.  +Mild appetite loss.  Pertinent PMH:  Past Medical History  Diagnosis Date  . Arthritis     Knees and low back  . Anxiety   . GERD (gastroesophageal reflux disease)   . Hyperlipidemia   . Anemia   . Chronic kidney disease     Secondary to HTN, chronic NSAIDs (excessive kidney scarring noted)--Dr. Deterding.    Marland Kitchen BPH with elevated PSA   . Hypertension   . Colon polyp 2005    Not sent to path; repeat recommended 5 yrs, so repeat colonoscopy 03/2010 was normal  . Palpitations     Worked up by Dr. Allyson Sabal (myoview neg, echo normal, 74mo event monitor unremarkable: no new  meds were recommended.    MEDS:  Outpatient Prescriptions Prior to Visit  Medication Sig Dispense Refill  . ALPRAZolam (XANAX) 0.5 MG tablet 1 tab qid prn  120 tablet  2  . aspirin 81 MG tablet Take 81 mg by mouth daily.        . calcitRIOL (ROCALTROL) 0.25 MCG capsule Take 1 capsule by mouth Every morning.      . cyclobenzaprine (FLEXERIL) 10 MG tablet take 1 tablet by mouth every morning ,MID AFTERNOON and at bedtime if needed for muscle spasm  90 tablet  5  . fluticasone (FLONASE) 50 MCG/ACT nasal spray 2 sprays each nostril qd  16 g  11  . HYDROcodone-acetaminophen (NORCO) 10-325 MG per tablet take 1/2 to 1 tablet by mouth every 6 hours if needed  100 tablet  0  . ketoconazole (NIZORAL) 2 % shampoo Apply to affected area three times per week as needed  240 mL  6  . losartan (COZAAR) 50 MG tablet Take 1 tablet (50 mg total) by mouth daily.  90 tablet  0  . omeprazole (PRILOSEC) 20 MG capsule Take 1 capsule (20 mg total) by mouth 2 (two) times daily.  180 capsule  3  . simvastatin (ZOCOR) 40 MG tablet Take 1 tablet (40 mg total) by mouth at bedtime.  90 tablet  1  . Tamsulosin HCl (FLOMAX) 0.4 MG CAPS take 1 capsule by mouth at bedtime  90 capsule  1  . temazepam (RESTORIL) 15 MG capsule Take 1-2 tablets by mouth At bedtime.      . triamcinolone cream (KENALOG) 0.1 % Apply topically 2 (two) times daily.  45 g  5  . valACYclovir (VALTREX) 1000 MG tablet TAKE 2 TABLETS BY MOUTH IMMEDIATELY AT ONSET OF LESION. REPEAT IN 12 HOURS  30 tablet  1  . diphenoxylate-atropine (LOMOTIL) 2.5-0.025 MG per tablet take 1 or 2 tablets by mouth four times a day if needed to PREVENT diarrhea  100 tablet  5  . Olopatadine HCl 0.2 % SOLN Apply 1 drop to  eye daily.  2.5 mL  5  Last reviewed on 11/24/2012  8:11 AM by Jeoffrey Massed, MD  PE: Blood pressure 122/66, pulse 68, height 5\' 9"  (1.753 m), weight 205 lb (92.987 kg), SpO2 97.00%. VS: noted--normal. Gen: alert, NAD, WELL- APPEARING. HEENT: eyes without injection, drainage, or swelling.  Ears: EACs clear, TMs with normal light reflex and landmarks.  Nose: Clear rhinorrhea, with some dried, crusty exudate adherent to mildly injected mucosa.  No purulent d/c.  No paranasal sinus TTP.  No facial swelling.  Throat and mouth without focal lesion.  No pharyngial swelling, erythema, or exudate.   Neck: supple, no LAD.   LUNGS: CTA bilat, nonlabored resps.   CV: RRR, no m/r/g. EXT: no c/c/e SKIN: no rash  IMPRESSION AND PLAN:  HYPERTENSION Problem stable.  Continue current medications and diet appropriate for this condition.  We have reviewed our general long term plan for this problem and also reviewed symptoms and signs that should prompt the patient to call or return to the office. He gets lytes/cr/Hb followed through his renal MD, Dr. Darrick Penna.   KIDNEY FAILURE Stable.  Keep routine nephrologist f/u's. Will obtain most recent o/v and lab results from Dr. Deterding's office.  Chronic knee pain Left.  Need to avoid oral NSAIDs due to CRI. Agreed to trial of voltaren gel, apply qid prn. Will ask patient to return for recheck of BUN/Cr after taking this regularly for a couple of weeks.  BPH with elevated PSA He has not seen his urologist in 18 mo or so.  He asks if I'll f/u his PSA today--ordered.  Viral URI Self-limited nature of this illness was discussed, questions answered.  Discussed symptomatic care, rest, fluids.   Warning signs/symptoms of worsening illness were discussed.  Patient instructed to call or return if any of these occur.    An After Visit Summary was printed and given to the patient.  FOLLOW UP: 95mo

## 2012-12-18 ENCOUNTER — Other Ambulatory Visit (INDEPENDENT_AMBULATORY_CARE_PROVIDER_SITE_OTHER): Payer: BC Managed Care – PPO

## 2012-12-18 DIAGNOSIS — N189 Chronic kidney disease, unspecified: Secondary | ICD-10-CM

## 2012-12-18 LAB — BASIC METABOLIC PANEL
BUN: 30 mg/dL — ABNORMAL HIGH (ref 6–23)
Creatinine, Ser: 2.5 mg/dL — ABNORMAL HIGH (ref 0.4–1.5)
GFR: 26.24 mL/min — ABNORMAL LOW (ref >60.00)
Glucose, Bld: 137 mg/dL — ABNORMAL HIGH (ref 70–99)
Potassium: 4.4 mEq/L (ref 3.5–5.1)

## 2012-12-18 NOTE — Progress Notes (Signed)
Labs only

## 2012-12-22 NOTE — Progress Notes (Signed)
Noted-PM 

## 2012-12-25 ENCOUNTER — Other Ambulatory Visit: Payer: Self-pay | Admitting: Family Medicine

## 2012-12-25 NOTE — Telephone Encounter (Signed)
eScribe request for refill on HYDROCODONE Last filled - 11/07/12, #100 X 0 Last seen on - 11/24/12 Follow up - 05/25/13 Please advise refills.

## 2012-12-26 NOTE — Telephone Encounter (Signed)
RX faxed

## 2013-01-13 ENCOUNTER — Other Ambulatory Visit: Payer: Self-pay | Admitting: *Deleted

## 2013-01-13 MED ORDER — TAMSULOSIN HCL 0.4 MG PO CAPS: ORAL_CAPSULE | ORAL | Status: DC

## 2013-01-13 NOTE — Telephone Encounter (Signed)
Rx to pharmacy/SLS 

## 2013-01-17 ENCOUNTER — Other Ambulatory Visit: Payer: Self-pay | Admitting: Family Medicine

## 2013-01-19 NOTE — Telephone Encounter (Signed)
eScribe request for refill on LOSARTAN Last filled - 10/17/12, #90 X 0 Last seen on - 11/24/12 Follow up - 05/25/13 RX sent

## 2013-01-20 ENCOUNTER — Telehealth: Payer: Self-pay

## 2013-01-20 MED ORDER — ALPRAZOLAM 0.5 MG PO TABS: ORAL_TABLET | ORAL | Status: DC

## 2013-01-20 NOTE — Telephone Encounter (Signed)
Rx printed

## 2013-01-20 NOTE — Telephone Encounter (Signed)
Signed by provider, faxed to pharmacy/AMT, CMA

## 2013-01-20 NOTE — Telephone Encounter (Signed)
Alprazolam request (last rx 09.23.13 #120x2) / AMT Please advise

## 2013-02-06 ENCOUNTER — Encounter: Payer: Self-pay | Admitting: Family Medicine

## 2013-02-23 ENCOUNTER — Telehealth: Payer: Self-pay | Admitting: *Deleted

## 2013-02-23 DIAGNOSIS — E785 Hyperlipidemia, unspecified: Secondary | ICD-10-CM

## 2013-02-23 MED ORDER — FLUTICASONE PROPIONATE 50 MCG/ACT NA SUSP: NASAL | Status: DC

## 2013-02-23 NOTE — Telephone Encounter (Signed)
Rx request to pharmacy/SLS  

## 2013-04-03 ENCOUNTER — Other Ambulatory Visit: Payer: Self-pay | Admitting: Family Medicine

## 2013-04-03 NOTE — Telephone Encounter (Signed)
Rx request to pharmacy/SLS  

## 2013-04-17 ENCOUNTER — Other Ambulatory Visit: Payer: Self-pay | Admitting: Family Medicine

## 2013-04-17 MED ORDER — SIMVASTATIN 40 MG PO TABS: 40.0000 mg | ORAL_TABLET | Freq: Every day | ORAL | Status: DC

## 2013-04-17 NOTE — Telephone Encounter (Signed)
Patient requesting simvastatin refill.  Patient was seen 11/24/12 and has an upcoming appt on 05/25/13.  Okay for six months per [facility] RX Refill Protocol

## 2013-05-22 ENCOUNTER — Encounter: Payer: Self-pay | Admitting: Family Medicine

## 2013-05-25 ENCOUNTER — Ambulatory Visit (INDEPENDENT_AMBULATORY_CARE_PROVIDER_SITE_OTHER): Payer: Medicare Other | Admitting: Family Medicine

## 2013-05-25 ENCOUNTER — Encounter: Payer: Self-pay | Admitting: Family Medicine

## 2013-05-25 VITALS — BP 143/79 | HR 67 | Temp 98.0°F | Resp 18 | Ht 69.0 in | Wt 209.0 lb

## 2013-05-25 DIAGNOSIS — E785 Hyperlipidemia, unspecified: Secondary | ICD-10-CM

## 2013-05-25 DIAGNOSIS — I10 Essential (primary) hypertension: Secondary | ICD-10-CM

## 2013-05-25 LAB — LIPID PANEL
Cholesterol: 160 mg/dL (ref 0–200)
HDL: 40.6 mg/dL (ref >39.00)
LDL Cholesterol: 95 mg/dL (ref 0–99)
Triglycerides: 120 mg/dL (ref 0.0–149.0)
VLDL: 24 mg/dL (ref 0.0–40.0)

## 2013-05-25 NOTE — Assessment & Plan Note (Signed)
Most recent cr 01/2013 was 3 at his nephrologist's office. Estimated CrCl about 30. Avoiding NSAIDs.  Tight bp control.  Cont low dose ARB and renal f/u.

## 2013-05-25 NOTE — Assessment & Plan Note (Signed)
FLP today. Tolerating simvastatin 40mg  qd.

## 2013-05-25 NOTE — Progress Notes (Signed)
OFFICE NOTE  05/25/2013  CC:  Chief Complaint  Patient presents with  . Follow-up    6 month     HPI: Patient is a 79 y.o. Caucasian male who is here for 6 mo f/u HTN, CRI, hyperlipidemia. Finally retired!   Feels tired, o/w ok. At last visit with Dr. Darrick Penna his calcitriol was increased.  Goes back for lab recheck 06/04/13. Saw Dr. Vernie Ammons since I last saw him and no signif info to report.  Pertinent PMH:  Past Medical History  Diagnosis Date  . Arthritis     Knees and low back  . Anxiety   . GERD (gastroesophageal reflux disease)   . Hyperlipidemia   . Anemia   . Chronic renal insufficiency, stage IV (severe)     Secondary to HTN, chronic NSAIDs (excessive kidney scarring noted)--Dr. Deterding.  Cr plateaued in 2.2-2.6 range until 01/2013 when it was 3.0.  Cr 2.69 on 05/04/2013.  Marland Kitchen BPH with elevated PSA   . Hypertension   . Colon polyp 2005    Not sent to path; repeat recommended 5 yrs, so repeat colonoscopy 03/2010 was normal  . Palpitations     Worked up by Dr. Allyson Sabal (myoview neg, echo normal, 41mo event monitor unremarkable: no new  meds were recommended.  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 Transthoracic echocardiogram  11/2010    Concentric LVH, diastolic dysfunction.  EF normal.  Mild mitral regurg.  . Acromioplasty  04/03/02    Left, with repair of complete rotator cuff repair  . Quadriceps tendon repair  2002    Both knees, right most recent  . Lumbar laminectomy  2001    L4  . Knee surgery      Bilateral   Past family and social history reviewed and there are no changes since the patient's last office visit with me.  MEDS:  Outpatient Prescriptions Prior to Visit  Medication Sig Dispense Refill  . ALPRAZolam (XANAX) 0.5 MG tablet 1 tab qid prn  120 tablet  2  . aspirin 81 MG tablet Take 81 mg by mouth daily.        . calcitRIOL (ROCALTROL) 0.25 MCG capsule Take 1 capsule by mouth  Every morning.      . cyclobenzaprine (FLEXERIL) 10 MG tablet take 1 tablet by mouth every morning ,MID AFTERNOON and at bedtime if needed for muscle spasm  90 tablet  5  . diclofenac sodium (VOLTAREN) 1 % GEL Apply 2 g topically 4 (four) times daily.  100 g  5  . diphenoxylate-atropine (LOMOTIL) 2.5-0.025 MG per tablet take 1 or 2 tablets by mouth four times a day if needed to PREVENT diarrhea  100 tablet  5  . fluticasone (FLONASE) 50 MCG/ACT nasal spray 2 sprays each nostril qd  48 g  0  . HYDROcodone-acetaminophen (NORCO) 10-325 MG per tablet take 1/2 to 1 tablet by mouth every 6 hours if needed  100 tablet  3  . ketoconazole (NIZORAL) 2 % shampoo Apply to affected area three times per week as needed  240 mL  6  . losartan (COZAAR) 50 MG tablet take 1 tablet by mouth once daily  90 tablet  1  . Olopatadine HCl 0.2 % SOLN Apply 1 drop to eye daily.  2.5 mL  5  . omeprazole (PRILOSEC) 20 MG capsule Take 1 capsule (20 mg total) by mouth 2 (two) times daily.  180 capsule  3  . simvastatin (ZOCOR) 40 MG tablet Take 1 tablet (40 mg total) by mouth at bedtime.  90 tablet  1  . tamsulosin (FLOMAX) 0.4 MG CAPS take 1 capsule by mouth at bedtime  90 capsule  1  . temazepam (RESTORIL) 15 MG capsule Take 1-2 tablets by mouth At bedtime.      . triamcinolone cream (KENALOG) 0.1 % Apply topically 2 (two) times daily.  45 g  5  . valACYclovir (VALTREX) 1000 MG tablet take 2 tablets by mouth IMMEDIATELY AT ONSET OF LESION. REPEAT IN 12 HOURS  30 tablet  1   No facility-administered medications prior to visit.    PE: Blood pressure 143/79, pulse 67, temperature 98 F (36.7 C), temperature source Temporal, resp. rate 18, height 5\' 9"  (1.753 m), weight 209 lb (94.802 kg), SpO2 96.00%. Gen: Alert, well appearing.  Patient is oriented to person, place, time, and situation. AFFECT: pleasant, lucid thought and speech. No further exam today.  IMPRESSION AND PLAN:  HYPERLIPIDEMIA FLP today. Tolerating  simvastatin 40mg  qd.  HYPERTENSION Stable. Lytes/cr monitored by renal.  Chronic renal insufficiency, stage III (moderate) Most recent cr 01/2013 was 3 at his nephrologist's office. Estimated CrCl about 30. Avoiding NSAIDs.  Tight bp control.  Cont low dose ARB and renal f/u.   An After Visit Summary was printed and given to the patient.  FOLLOW UP: 6 mo

## 2013-05-25 NOTE — Assessment & Plan Note (Signed)
Stable. Lytes/cr monitored by renal.

## 2013-06-06 ENCOUNTER — Other Ambulatory Visit: Payer: Self-pay | Admitting: Family Medicine

## 2013-07-22 ENCOUNTER — Encounter: Payer: Self-pay | Admitting: Family Medicine

## 2013-07-22 ENCOUNTER — Ambulatory Visit (INDEPENDENT_AMBULATORY_CARE_PROVIDER_SITE_OTHER): Payer: Medicare Other | Admitting: Family Medicine

## 2013-07-22 ENCOUNTER — Other Ambulatory Visit: Payer: Self-pay | Admitting: Family Medicine

## 2013-07-22 VITALS — BP 125/73 | HR 85 | Temp 98.8°F | Resp 16 | Ht 69.0 in | Wt 212.0 lb

## 2013-07-22 DIAGNOSIS — J029 Acute pharyngitis, unspecified: Secondary | ICD-10-CM

## 2013-07-22 MED ORDER — HYDROCODONE-HOMATROPINE 5-1.5 MG/5ML PO SYRP: ORAL_SOLUTION | ORAL | Status: DC

## 2013-07-22 NOTE — Telephone Encounter (Signed)
Patient Information:  Caller Name: [name]  Phone: [phone]  Patient: Philip, Shields  Gender: Male  DOB: 24-Jan-1933  Age: 79 Years  PCP: Earley Favor [facility])  Office Follow Up:  Does the office need to follow up with this patient?: No  Instructions For The Office: N/A  RN Note:  Patient has chronic Kidney disease and feels it is important for him to be seen.  Symptoms  Reason For Call & Symptoms: Woke this AM with a sore throat.  He is concerned due to he is leaving for a 2 week trip.  He does not want triage.  He is not running a fever.  Reviewed Health History In EMR: N/A  Reviewed Medications In EMR: N/A  Reviewed Allergies In EMR: N/A  Reviewed Surgeries / Procedures: N/A  Date of Onset of Symptoms: 07/22/2013  Guideline(s) Used:  No Protocol Available - Sick Adult  Disposition Per Guideline:   See Today in Office  Reason For Disposition Reached:   Patient wants to be seen  Advice Given:  N/A  Patient Will Follow Care Advice:  YES  Appointment Scheduled:  07/22/2013 15:15:00 Appointment Scheduled Provider:  Earley Favor (Family Practice)

## 2013-07-22 NOTE — Progress Notes (Signed)
OFFICE NOTE  07/22/2013  CC:  Chief Complaint  Patient presents with  . Sore Throat  . Facial Pain    left sided burning     HPI: Patient is a 79 y.o. Caucasian male who is here for sore throat.  Onset this morning: ST and left side of face ("feels deep in there").  No fevers. No nasal cong but he feels a slight cough "coming on" at this time.  Feels slightly run down/tired, but no aches and pains. No HA.  No upper teeth pain.  No SOB or CP.   Pertinent PMH:  Past Medical History  Diagnosis Date  . Arthritis     Knees and low back  . Anxiety   . GERD (gastroesophageal reflux disease)   . Hyperlipidemia   . Anemia   . Chronic renal insufficiency, stage IV (severe)     Secondary to HTN, chronic NSAIDs (excessive kidney scarring noted)--Dr. Deterding.  Cr plateaued in 2.2-2.6 range until 01/2013 when it was 3.0.  Cr 2.69 on 05/04/2013.  Marland Kitchen BPH with elevated PSA   . Hypertension   . Colon polyp 2005    Not sent to path; repeat recommended 5 yrs, so repeat colonoscopy 03/2010 was normal  . Palpitations     Worked up by Dr. Allyson Sabal (myoview neg, echo normal, 66mo event monitor unremarkable: no new  meds were recommended.  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 aspirin 81 MG tablet Take 81 mg by mouth daily.        . calcitRIOL (ROCALTROL) 0.25 MCG capsule Take 1 capsule by mouth Every morning.      . cyclobenzaprine (FLEXERIL) 10 MG tablet take 1 tablet by mouth every morning ,MID AFTERNOON and at bedtime if needed for muscle spasm  90 tablet  5  . diclofenac sodium (VOLTAREN) 1 % GEL Apply 2 g topically 4 (four) times daily.  100 g  5  . fluticasone (FLONASE) 50 MCG/ACT nasal spray 2 sprays each nostril qd  48 g  0  . HYDROcodone-acetaminophen (NORCO) 10-325 MG per tablet take 1/2 to 1 tablet by mouth every 6 hours if needed  100 tablet  3  . losartan (COZAAR) 50 MG tablet take 1  tablet by mouth once daily  90 tablet  1  . omeprazole (PRILOSEC) 20 MG capsule take 1 capsule by mouth twice a day  180 capsule  3  . simvastatin (ZOCOR) 40 MG tablet Take 1 tablet (40 mg total) by mouth at bedtime.  90 tablet  1  . tamsulosin (FLOMAX) 0.4 MG CAPS capsule take 1 capsule by mouth at bedtime  90 capsule  1  . temazepam (RESTORIL) 15 MG capsule Take 1-2 tablets by mouth At bedtime.      . valACYclovir (VALTREX) 1000 MG tablet take 2 tablets by mouth IMMEDIATELY AT ONSET OF LESION. REPEAT IN 12 HOURS  30 tablet  1  . ALPRAZolam (XANAX) 0.5 MG tablet 1 tab qid prn  120 tablet  2  . diphenoxylate-atropine (LOMOTIL) 2.5-0.025 MG per tablet take 1 or 2 tablets by mouth four times a day if needed to PREVENT diarrhea  100 tablet  5  . ketoconazole (NIZORAL) 2 % shampoo Apply to affected area three times per week as needed  240 mL  6  . Olopatadine HCl 0.2 % SOLN Apply 1 drop to eye daily.  2.5 mL  5   No facility-administered medications prior to visit.    PE: Blood pressure 125/73, pulse 85, temperature 98.8 F (37.1 C), temperature source Temporal, resp. rate 16, height 5\' 9"  (1.753 m), weight 212 lb (96.163 kg), SpO2 95.00%. VS: noted--normal. Gen: alert, NAD, NONTOXIC APPEARING. HEENT: eyes without injection, drainage, or swelling.  Ears: EACs clear, TMs with normal light reflex and landmarks.  Nose: Clear rhinorrhea, with some dried, crusty exudate adherent to mildly injected mucosa.  No purulent d/c.  No paranasal sinus TTP.  No facial swelling.  Throat and mouth without focal lesion.  No pharyngial swelling or exudate but mild diffuse hyperemia of pharynx diffusely is noted.  Neck: supple, no LAD but mildly TTP in left submandibular LN region. LUNGS: CTA bilat, nonlabored resps.   CV: RRR, no m/r/g. EXT: no c/c/e SKIN: no rash  LAB: rapid strep neg  IMPRESSION AND PLAN:  Viral URI/pharyngitis. EArly in the course of the illness. Group A throat clx sent today. Hycodan  susp, 1-2 tsp q6hr prn.  An After Visit Summary was printed and given to the patient.  FOLLOW UP: prn

## 2013-07-22 NOTE — Telephone Encounter (Signed)
Patient has an appointment for today.   Patient can get refills at that time.

## 2013-07-24 ENCOUNTER — Other Ambulatory Visit: Payer: Self-pay | Admitting: Family Medicine

## 2013-07-24 ENCOUNTER — Telehealth: Payer: Self-pay | Admitting: Family Medicine

## 2013-07-24 LAB — CULTURE, GROUP A STREP

## 2013-07-24 MED ORDER — HYDROCODONE-ACETAMINOPHEN 10-325 MG PO TABS: ORAL_TABLET | ORAL | Status: DC

## 2013-07-24 NOTE — Telephone Encounter (Signed)
Patient requesting his hydrocodone refill because he is going out of town.  Rx last printed on 12/25/12 x 3 refills.  Patient also wanted to know if his throat culture is back.  Please advise.

## 2013-10-15 ENCOUNTER — Other Ambulatory Visit: Payer: Self-pay | Admitting: Family Medicine

## 2013-10-16 ENCOUNTER — Other Ambulatory Visit: Payer: Self-pay | Admitting: Family Medicine

## 2013-10-16 MED ORDER — OSELTAMIVIR PHOSPHATE 30 MG PO CAPS: ORAL_CAPSULE | ORAL | Status: DC

## 2013-10-16 NOTE — Progress Notes (Signed)
Pt's wife here today with ILI. I sent tamiflu prophylaxis rx for Philip Shields to his pharmacy today.

## 2013-11-25 ENCOUNTER — Encounter: Payer: Self-pay | Admitting: Family Medicine

## 2013-11-25 ENCOUNTER — Ambulatory Visit (INDEPENDENT_AMBULATORY_CARE_PROVIDER_SITE_OTHER): Payer: Medicare Other | Admitting: Family Medicine

## 2013-11-25 VITALS — BP 129/76 | HR 66 | Temp 98.0°F | Resp 18 | Ht 69.0 in | Wt 219.0 lb

## 2013-11-25 DIAGNOSIS — M129 Arthropathy, unspecified: Secondary | ICD-10-CM

## 2013-11-25 DIAGNOSIS — N184 Chronic kidney disease, stage 4 (severe): Secondary | ICD-10-CM

## 2013-11-25 DIAGNOSIS — I10 Essential (primary) hypertension: Secondary | ICD-10-CM

## 2013-11-25 DIAGNOSIS — E785 Hyperlipidemia, unspecified: Secondary | ICD-10-CM

## 2013-11-25 NOTE — Progress Notes (Signed)
OFFICE NOTE  11/25/2013  CC:  Chief Complaint  Patient presents with  . Follow-up     HPI: Patient is a 80 y.o. Caucasian male who is here for 6 mo f/u HTN, hyperlipidemia, CRI, chronic osteoarthritic pain in knees and LB that require narcotics for functioning. Last neph f/u 07/2013, rocaltrol was increased to 1 qd alt /w 1 bid.  Has f/u with them next month.  No home bp monitoring. Not following any particular restictive diet of any kind.  Synvisc injection series done on right knee and he feels like it has helped some.  He doesn't do any exercise.  Typically takes 1/2-1 norco tab per day.  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  Pertinent PMH:  Past Medical History  Diagnosis Date  . Arthritis     Knees and low back  . Anxiety   . GERD (gastroesophageal reflux disease)   . Hyperlipidemia   . Anemia   . Chronic renal insufficiency, stage IV (severe)     Secondary to HTN, chronic NSAIDs (excessive kidney scarring noted)--Dr. Deterding.  Cr plateaued in 2.2-2.6 range until 01/2013 when it was 3.0.  Cr 2.69 on 05/04/2013.  Marland Kitchen BPH with elevated PSA   . Hypertension   . Colon polyp 2005    Not sent to path; repeat recommended 5 yrs, so repeat colonoscopy 03/2010 was normal  . Palpitations     Worked up by Dr. Gwenlyn Found (myoview neg, echo normal, 44mo event monitor unremarkable: no new  meds were recommended.  Vira Agar' corneal dystrophy     left corneal implant   Past surgical, social, and family history reviewed and no changes noted since last office visit.  MEDS:  Outpatient Prescriptions Prior to Visit  Medication Sig Dispense Refill  . ALPRAZolam (XANAX) 0.5 MG tablet 1 tab qid prn  120 tablet  2  . aspirin 81 MG tablet Take 81 mg by mouth daily.        . calcitRIOL (ROCALTROL) 0.25 MCG capsule Take 1 capsule by mouth Every morning.      . cyclobenzaprine (FLEXERIL) 10 MG tablet take 1 tablet by mouth every morning ,MID AFTERNOON and at bedtime if needed  for muscle spasm  90 tablet  5  . diclofenac sodium (VOLTAREN) 1 % GEL Apply 2 g topically 4 (four) times daily.  100 g  5  . HYDROcodone-acetaminophen (NORCO) 10-325 MG per tablet take 1/2 to 1 tablet by mouth every 6 hours if needed  100 tablet  3  . ketoconazole (NIZORAL) 2 % shampoo Apply to affected area three times per week as needed  240 mL  6  . losartan (COZAAR) 50 MG tablet take 1 tablet by mouth once daily  90 tablet  1  . omeprazole (PRILOSEC) 20 MG capsule take 1 capsule by mouth twice a day  180 capsule  3  . simvastatin (ZOCOR) 40 MG tablet take 1 tablet by mouth at bedtime  90 tablet  1  . tamsulosin (FLOMAX) 0.4 MG CAPS capsule take 1 capsule by mouth at bedtime  90 capsule  1  . temazepam (RESTORIL) 15 MG capsule Take 1-2 tablets by mouth At bedtime.      . valACYclovir (VALTREX) 1000 MG tablet take 2 tablets by mouth IMMEDIATELY AT ONSET OF LESION. REPEAT IN 12 HOURS  30 tablet  1  . diphenoxylate-atropine (LOMOTIL) 2.5-0.025 MG per tablet take 1 or 2 tablets by mouth four times a day if needed to  PREVENT diarrhea  100 tablet  5  . fluticasone (FLONASE) 50 MCG/ACT nasal spray 2 sprays each nostril qd  48 g  0  . Olopatadine HCl 0.2 % SOLN Apply 1 drop to eye daily.  2.5 mL  5  . HYDROcodone-homatropine (HYCODAN) 5-1.5 MG/5ML syrup 1-2 tsp po q6h prn cough  120 mL  0  . oseltamivir (TAMIFLU) 30 MG capsule 1 cap po qd x 10d  10 capsule  0   No facility-administered medications prior to visit.    PE: Blood pressure 129/76, pulse 66, temperature 98 F (36.7 C), temperature source Temporal, resp. rate 18, height 5\' 9"  (1.753 m), weight 219 lb (99.338 kg), SpO2 95.00%. Gen: Alert, well appearing.  Patient is oriented to person, place, time, and situation. CV: RRR, no m/r/g.   LUNGS: CTA bilat, nonlabored resps, good aeration in all lung fields. EXT: 1+ pitting in both lower legs  IMPRESSION AND PLAN:  HYPERTENSION The current medical regimen is effective;  continue present  plan and medications. Gets lytes/cr through his nephrologist's office.  Hyperlipidemia Tolerating statin. Continue this.   Labs next f/u in 40mo.  Chronic renal insufficiency, stage IV (severe) Followed closely by nephrologist. CrCl approx 20 ml/min. Most recent nephrology f/u 07/2013 was uneventful.  ARTHRITIS Osteoarthritis of multiple joints. Must avoid NSAIDs. He takes minimal doses of vicodin to maintain good function.  No new rx's today.   An After Visit Summary was printed and given to the patient.   FOLLOW UP: 46mo for fasting CPE

## 2013-11-25 NOTE — Assessment & Plan Note (Signed)
Tolerating statin. Continue this.   Labs next f/u in 54mo.

## 2013-11-25 NOTE — Assessment & Plan Note (Signed)
The current medical regimen is effective;  continue present plan and medications. Gets lytes/cr through his nephrologist's office.

## 2013-11-25 NOTE — Progress Notes (Signed)
Pre visit review using our clinic review tool, if applicable. No additional management support is needed unless otherwise documented below in the visit note. 

## 2013-11-25 NOTE — Assessment & Plan Note (Signed)
Osteoarthritis of multiple joints. Must avoid NSAIDs. He takes minimal doses of vicodin to maintain good function.  No new rx's today.

## 2013-11-25 NOTE — Assessment & Plan Note (Addendum)
Followed closely by nephrologist. CrCl approx 20 ml/min. Most recent nephrology f/u 07/2013 was uneventful.

## 2013-11-27 ENCOUNTER — Telehealth: Payer: Self-pay | Admitting: Family Medicine

## 2013-11-27 NOTE — Telephone Encounter (Signed)
Relevant patient education assigned to patient using Emmi. ° °

## 2013-12-23 ENCOUNTER — Encounter: Payer: Self-pay | Admitting: Family Medicine

## 2014-01-26 ENCOUNTER — Telehealth: Payer: Self-pay | Admitting: Family Medicine

## 2014-01-26 MED ORDER — HYDROCODONE-ACETAMINOPHEN 10-325 MG PO TABS: ORAL_TABLET | ORAL | Status: DC

## 2014-01-26 NOTE — Telephone Encounter (Signed)
Rx's for hydrocodone/APAP 10/325 printed.

## 2014-01-26 NOTE — Telephone Encounter (Signed)
Patient requesting refill of his hydrocodone 10/325.  Patient last seen 11/25/13.  Last rx was written 07/24/13 x 3 refills.  Please advise refill.

## 2014-01-27 NOTE — Telephone Encounter (Signed)
Patient aware rx's at front desk for pick up.

## 2014-02-02 ENCOUNTER — Other Ambulatory Visit: Payer: Self-pay | Admitting: Family Medicine

## 2014-02-02 MED ORDER — LOSARTAN POTASSIUM 50 MG PO TABS: ORAL_TABLET | ORAL | Status: DC

## 2014-03-22 ENCOUNTER — Encounter: Payer: Self-pay | Admitting: Family Medicine

## 2014-03-23 ENCOUNTER — Telehealth: Payer: Self-pay | Admitting: Family Medicine

## 2014-03-23 NOTE — Telephone Encounter (Signed)
Pt requesting rf of prochlorperazine 10 mg tabs.  He says he is going out of town for two weeks and he takes this medication for nausea.  Please advise.

## 2014-03-24 MED ORDER — PROCHLORPERAZINE MALEATE 10 MG PO TABS: 10.0000 mg | ORAL_TABLET | Freq: Four times a day (QID) | ORAL | Status: DC | PRN

## 2014-03-24 NOTE — Telephone Encounter (Signed)
OK to RF as previously prescribed with 5 additional RF's.-thx

## 2014-05-19 ENCOUNTER — Other Ambulatory Visit: Payer: Self-pay | Admitting: Family Medicine

## 2014-05-19 MED ORDER — SIMVASTATIN 40 MG PO TABS: ORAL_TABLET | ORAL | Status: DC

## 2014-05-26 ENCOUNTER — Ambulatory Visit (INDEPENDENT_AMBULATORY_CARE_PROVIDER_SITE_OTHER): Payer: Medicare Other | Admitting: Family Medicine

## 2014-05-26 ENCOUNTER — Encounter: Payer: Self-pay | Admitting: Family Medicine

## 2014-05-26 VITALS — BP 145/79 | HR 69 | Temp 97.7°F | Resp 18 | Ht 69.0 in | Wt 217.0 lb

## 2014-05-26 DIAGNOSIS — Z0389 Encounter for observation for other suspected diseases and conditions ruled out: Secondary | ICD-10-CM

## 2014-05-26 DIAGNOSIS — N184 Chronic kidney disease, stage 4 (severe): Secondary | ICD-10-CM

## 2014-05-26 DIAGNOSIS — R972 Elevated prostate specific antigen [PSA]: Secondary | ICD-10-CM

## 2014-05-26 DIAGNOSIS — I10 Essential (primary) hypertension: Secondary | ICD-10-CM

## 2014-05-26 DIAGNOSIS — E785 Hyperlipidemia, unspecified: Secondary | ICD-10-CM

## 2014-05-26 LAB — LIPID PANEL
Cholesterol: 183 mg/dL (ref 0–200)
HDL: 47.1 mg/dL (ref >39.00)
LDL Cholesterol: 119 mg/dL — ABNORMAL HIGH (ref 0–99)
NONHDL: 135.9
Total CHOL/HDL Ratio: 4
Triglycerides: 87 mg/dL (ref 0.0–149.0)
VLDL: 17.4 mg/dL (ref 0.0–40.0)

## 2014-05-26 LAB — PSA, MEDICARE: PSA: 7.26 ng/mL — AB (ref 0.10–4.00)

## 2014-05-26 NOTE — Progress Notes (Signed)
OFFICE NOTE  05/26/2014  CC:  Chief Complaint  Patient presents with  . Annual Exam    fasting   HPI: Patient is a 80 y.o. Caucasian male who is here for 6 mo f/u HTN, hyperlipidemia, CRI, chronic anxiety. No acute complaints. Knees bother him but periodic injections at his orthopedist seem to help. Uses minimal voltaren--only on thumb MCP joint.   We discussed prostate cancer screening today--he has hx of BPH with mildly elevated PSA in the past. Saw urologist, no bx was done.  He wants to proceed with DRE and PSA today and this will be his final screen for this cancer.  Last saw Dr. Jimmy Footman, nephrologist, 12/2013--note and labs were sent to me and reviewed: all stable. Cr 2.6.  Urinalysis and protein testing normal.  CBC wnl except very mild normocytic anemia, electrolytes normal.  PTH ordered per note but no result in paperwork.  Pertinent PMH:  Past Medical History  Diagnosis Date  . Arthritis     Knees and low back  . Anxiety   . GERD (gastroesophageal reflux disease)   . Hyperlipidemia   . Anemia   . Chronic renal insufficiency, stage IV (severe) 2014/15    CrCl in the 20s as of 02/2014.  Kidney dz secondary to HTN, chronic NSAIDs (excessive kidney scarring noted)--Dr. Deterding.  Cr 2.2-2.6 range.  Marland Kitchen BPH with elevated PSA   . Hypertension   . Colon polyp 2005    Not sent to path; repeat recommended 5 yrs, so repeat colonoscopy 03/2010 was normal  . Palpitations     Worked up by Dr. Gwenlyn Found (myoview neg, echo normal, 3mo event monitor unremarkable: no new  meds were recommended.  Vira Agar' corneal dystrophy     left corneal implant   Past Surgical History  Procedure Laterality Date  . Transthoracic echocardiogram  11/2010    Concentric LVH, diastolic dysfunction.  EF normal.  Mild mitral regurg.  . Acromioplasty  04/03/02    Left, with repair of complete rotator cuff repair  . Quadriceps tendon repair  2002    Both knees, right most recent  . Lumbar laminectomy  2001     L4  . Knee surgery      Bilateral    MEDS:  Outpatient Prescriptions Prior to Visit  Medication Sig Dispense Refill  . ALPRAZolam (XANAX) 0.5 MG tablet 1 tab qid prn  120 tablet  2  . aspirin 81 MG tablet Take 81 mg by mouth daily.        . calcitRIOL (ROCALTROL) 0.25 MCG capsule Take 1 capsule by mouth Every morning.      . cyclobenzaprine (FLEXERIL) 10 MG tablet take 1 tablet by mouth every morning ,MID AFTERNOON and at bedtime if needed for muscle spasm  90 tablet  5  . fluticasone (FLONASE) 50 MCG/ACT nasal spray 2 sprays each nostril qd  48 g  0  . HYDROcodone-acetaminophen (NORCO) 10-325 MG per tablet take 1/2 to 1 tablet by mouth every 6 hours if needed  100 tablet  0  . ketoconazole (NIZORAL) 2 % shampoo Apply to affected area three times per week as needed  240 mL  6  . losartan (COZAAR) 50 MG tablet take 1 tablet by mouth once daily  90 tablet  1  . prochlorperazine (COMPAZINE) 10 MG tablet Take 1 tablet (10 mg total) by mouth every 6 (six) hours as needed for nausea or vomiting.  30 tablet  5  . simvastatin (ZOCOR) 40 MG tablet  take 1 tablet by mouth at bedtime  90 tablet  1  . tamsulosin (FLOMAX) 0.4 MG CAPS capsule take 1 capsule by mouth at bedtime  90 capsule  1  . temazepam (RESTORIL) 15 MG capsule Take 1-2 tablets by mouth At bedtime.      . valACYclovir (VALTREX) 1000 MG tablet take 2 tablets by mouth IMMEDIATELY AT ONSET OF LESION. REPEAT IN 12 HOURS  30 tablet  1  . diclofenac sodium (VOLTAREN) 1 % GEL Apply 2 g topically 4 (four) times daily.  100 g  5  . diphenoxylate-atropine (LOMOTIL) 2.5-0.025 MG per tablet take 1 or 2 tablets by mouth four times a day if needed to PREVENT diarrhea  100 tablet  5  . omeprazole (PRILOSEC) 20 MG capsule take 1 capsule by mouth twice a day  180 capsule  3  . Olopatadine HCl 0.2 % SOLN Apply 1 drop to eye daily.  2.5 mL  5   No facility-administered medications prior to visit.    PE: Blood pressure 145/79, pulse 69, temperature  97.7 F (36.5 C), temperature source Temporal, resp. rate 18, height 5\' 9"  (1.753 m), weight 217 lb (98.431 kg), SpO2 95.00%. Gen: Alert, well appearing.  Patient is oriented to person, place, time, and situation. AFFECT: pleasant, lucid thought and speech. ENT: Ears: EACs clear, normal epithelium.  TMs with good light reflex and landmarks bilaterally.  Eyes: no injection, icteris, swelling, or exudate.  EOMI, PERRLA. Nose: no drainage or turbinate edema/swelling.  No injection or focal lesion.  Mouth: lips without lesion/swelling.  Oral mucosa pink and moist.  Dentition intact and without obvious caries or gingival swelling.  Oropharynx without erythema, exudate, or swelling.  Neck: supple/nontender.  No LAD, mass, or TM.  Carotid pulses 2+ bilaterally, without bruits. CV: RRR, no m/r/g.   LUNGS: CTA bilat, nonlabored resps, good aeration in all lung fields. ABD: soft, NT, ND, BS normal.  No hepatospenomegaly or mass.  No bruits. EXT: no clubbing, cyanosis, or edema.  Musculoskeletal: no joint swelling, erythema, warmth, or tenderness.  ROM of all joints intact. Skin - no sores or suspicious lesions or rashes or color changes Rectal exam: negative without mass, lesions or tenderness. PROSTATE EXAM: smooth and symmetric without nodules or tenderness.  Mild diffuse enlargement.  LABS: none today Recent:  Lab Results  Component Value Date   CHOL 160 05/25/2013   HDL 40.60 05/25/2013   LDLCALC 95 05/25/2013   LDLDIRECT 139.0 04/21/2010   TRIG 120.0 05/25/2013   CHOLHDL 4 05/25/2013   Lab Results  Component Value Date   PSA 5.34* 11/24/2012   PSA 4.96* 04/17/2011   PSA 6.08* 04/21/2010    IMPRESSION AND PLAN:  1) HTN; The current medical regimen is effective;  continue present plan and medications.  2) Hyperlipidemia: tolerating statin. FLP today.  3) CRI stage 4; stable as per last f/u with nephrologist 12/2013.  He has f/u again with Dr. Jimmy Footman 06/2014.  4) Chronic anxiety;The  current medical regimen is effective;  continue present plan and medications.  5) Prostate cancer screening, BPH with hx of elevated PSA. After discussion today, we decided that we'll do one final screening DRE (normal today) and PSA level.  6) Prev health: he said he got prevnar 13 a couple of months ago.  An After Visit Summary was printed and given to the patient.  FOLLOW UP: 51mo

## 2014-05-26 NOTE — Progress Notes (Signed)
Pre visit review using our clinic review tool, if applicable. No additional management support is needed unless otherwise documented below in the visit note. 

## 2014-06-09 ENCOUNTER — Telehealth: Payer: Self-pay | Admitting: Family Medicine

## 2014-06-09 MED ORDER — CLOTRIMAZOLE-BETAMETHASONE 1-0.05 % EX CREA: 1.0000 "application " | TOPICAL_CREAM | Freq: Two times a day (BID) | CUTANEOUS | Status: DC

## 2014-06-09 NOTE — Telephone Encounter (Signed)
Lotrisone rx sent to pharmacy.

## 2014-06-09 NOTE — Telephone Encounter (Signed)
Pt LMOM requesting lotrisone Rx for rash under his arm.  Please advise.

## 2014-06-21 ENCOUNTER — Telehealth: Payer: Self-pay | Admitting: Family Medicine

## 2014-06-21 MED ORDER — ALPRAZOLAM 0.5 MG PO TABS: ORAL_TABLET | ORAL | Status: DC

## 2014-06-21 NOTE — Telephone Encounter (Signed)
Alprazolam rx printed. 

## 2014-06-21 NOTE — Telephone Encounter (Signed)
Rf request for alprazolam.  Last OV was 05/26/14.  Last Rx printed 01/20/13 x 2 rfs.  Please advise.

## 2014-06-21 NOTE — Telephone Encounter (Signed)
Rx faxed

## 2014-08-02 ENCOUNTER — Other Ambulatory Visit: Payer: Self-pay | Admitting: *Deleted

## 2014-08-02 MED ORDER — TAMSULOSIN HCL 0.4 MG PO CAPS: ORAL_CAPSULE | ORAL | Status: DC

## 2014-10-19 ENCOUNTER — Other Ambulatory Visit: Payer: Self-pay | Admitting: Family Medicine

## 2014-10-19 MED ORDER — SIMVASTATIN 40 MG PO TABS: ORAL_TABLET | ORAL | Status: DC

## 2014-10-19 MED ORDER — LOSARTAN POTASSIUM 50 MG PO TABS: ORAL_TABLET | ORAL | Status: DC

## 2014-11-03 ENCOUNTER — Ambulatory Visit (INDEPENDENT_AMBULATORY_CARE_PROVIDER_SITE_OTHER): Payer: Medicare Other | Admitting: Nurse Practitioner

## 2014-11-03 ENCOUNTER — Encounter: Payer: Self-pay | Admitting: Nurse Practitioner

## 2014-11-03 ENCOUNTER — Other Ambulatory Visit: Payer: Self-pay | Admitting: *Deleted

## 2014-11-03 VITALS — BP 120/60 | HR 104 | Temp 94.0°F | Ht 69.0 in | Wt 221.0 lb

## 2014-11-03 DIAGNOSIS — J069 Acute upper respiratory infection, unspecified: Secondary | ICD-10-CM

## 2014-11-03 MED ORDER — TEMAZEPAM 15 MG PO CAPS: 15.0000 mg | ORAL_CAPSULE | Freq: Every evening | ORAL | Status: DC | PRN

## 2014-11-03 NOTE — Patient Instructions (Signed)
You have a cold virus causing your symptoms. The average duration of cold symptoms is 14 days. Start daily sinus rinses (neilmed Sinus Rinse). Tylenol or ibuprophen for headache. Vicks vapor rub under nose to help breathe. Benzocaine throat lozenges for sore throat. Sip fluids every hour. Rest. If you are not feeling better in 10 days or develop fever or chest pain, call us for re-evaluation. Feel better!  Upper Respiratory Infection, Adult An upper respiratory infection (URI) is also sometimes known as the common cold. The upper respiratory tract includes the nose, sinuses, throat, trachea, and bronchi. Bronchi are the airways leading to the lungs. Most people improve within 1 week, but symptoms can last up to 2 weeks. A residual cough may last even longer.  CAUSES Many different viruses can infect the tissues lining the upper respiratory tract. The tissues become irritated and inflamed and often become very moist. Mucus production is also common. A cold is contagious. You can easily spread the virus to others by oral contact. This includes kissing, sharing a glass, coughing, or sneezing. Touching your mouth or nose and then touching a surface, which is then touched by another person, can also spread the virus. SYMPTOMS  Symptoms typically develop 1 to 3 days after you come in contact with a cold virus. Symptoms vary from person to person. They may include:  Runny nose.  Sneezing.  Nasal congestion.  Sinus irritation.  Sore throat.  Loss of voice (laryngitis).  Cough.  Fatigue.  Muscle aches.  Loss of appetite.  Headache.  Low-grade fever. DIAGNOSIS  You might diagnose your own cold based on familiar symptoms, since most people get a cold 2 to 3 times a year. Your caregiver can confirm this based on your exam. Most importantly, your caregiver can check that your symptoms are not due to another disease such as strep throat, sinusitis, pneumonia, asthma, or epiglottitis. Blood tests,  throat tests, and X-rays are not necessary to diagnose a common cold, but they may sometimes be helpful in excluding other more serious diseases. Your caregiver will decide if any further tests are required. RISKS AND COMPLICATIONS  You may be at risk for a more severe case of the common cold if you smoke cigarettes, have chronic heart disease (such as heart failure) or lung disease (such as asthma), or if you have a weakened immune system. The very young and very old are also at risk for more serious infections. Bacterial sinusitis, middle ear infections, and bacterial pneumonia can complicate the common cold. The common cold can worsen asthma and chronic obstructive pulmonary disease (COPD). Sometimes, these complications can require emergency medical care and may be life-threatening. PREVENTION  The best way to protect against getting a cold is to practice good hygiene. Avoid oral or hand contact with people with cold symptoms. Wash your hands often if contact occurs. There is no clear evidence that vitamin C, vitamin E, echinacea, or exercise reduces the chance of developing a cold. However, it is always recommended to get plenty of rest and practice good nutrition. TREATMENT  Treatment is directed at relieving symptoms. There is no cure. Antibiotics are not effective, because the infection is caused by a virus, not by bacteria. Treatment may include:  Increased fluid intake. Sports drinks offer valuable electrolytes, sugars, and fluids.  Breathing heated mist or steam (vaporizer or shower).  Eating chicken soup or other clear broths, and maintaining good nutrition.  Getting plenty of rest.  Using gargles or lozenges for comfort.  Controlling fevers   with ibuprofen or acetaminophen as directed by your caregiver.  Increasing usage of your inhaler if you have asthma. Zinc gel and zinc lozenges, taken in the first 24 hours of the common cold, can shorten the duration and lessen the severity of  symptoms. Pain medicines may help with fever, muscle aches, and throat pain. A variety of non-prescription medicines are available to treat congestion and runny nose. Your caregiver can make recommendations and may suggest nasal or lung inhalers for other symptoms.  HOME CARE INSTRUCTIONS   Only take over-the-counter or prescription medicines for pain, discomfort, or fever as directed by your caregiver.  Use a warm mist humidifier or inhale steam from a shower to increase air moisture. This may keep secretions moist and make it easier to breathe.  Drink enough water and fluids to keep your urine clear or pale yellow.  Rest as needed.  Return to work when your temperature has returned to normal or as your caregiver advises. You may need to stay home longer to avoid infecting others. You can also use a face mask and careful hand washing to prevent spread of the virus. SEEK MEDICAL CARE IF:   After the first few days, you feel you are getting worse rather than better.  You need your caregiver's advice about medicines to control symptoms.  You develop chills, worsening shortness of breath, or brown or red sputum. These may be signs of pneumonia.  You develop yellow or brown nasal discharge or pain in the face, especially when you bend forward. These may be signs of sinusitis.  You develop a fever, swollen neck glands, pain with swallowing, or white areas in the back of your throat. These may be signs of strep throat. SEEK IMMEDIATE MEDICAL CARE IF:   You have a fever.  You develop severe or persistent headache, ear pain, sinus pain, or chest pain.  You develop wheezing, a prolonged cough, cough up blood, or have a change in your usual mucus (if you have chronic lung disease).  You develop sore muscles or a stiff neck. Document Released: 04/10/2001 Document Revised: 01/07/2012 Document Reviewed: 02/16/2011 [employer] Patient Information 2014 [employer]. 

## 2014-11-03 NOTE — Progress Notes (Signed)
Pre visit review using our clinic review tool, if applicable. No additional management support is needed unless otherwise documented below in the visit note. 

## 2014-11-03 NOTE — Progress Notes (Signed)
   Subjective:    Patient ID: Philip Shields, male    DOB: 04/02/1933, 81 y.o.   MRN: [id number]  Cough This is a new problem. The current episode started 1 to 4 weeks ago (1 week). The problem has been gradually improving. The cough is non-productive. Associated symptoms include nasal congestion, postnasal drip and a sore throat. Pertinent negatives include no chest pain, chills, ear congestion, ear pain, fever, headaches, shortness of breath or wheezing. Nothing aggravates the symptoms. He has tried nothing for the symptoms.      Review of Systems  Constitutional: Negative for fever and chills.  HENT: Positive for congestion, postnasal drip and sore throat. Negative for ear pain, sinus pressure and voice change.   Respiratory: Positive for cough. Negative for chest tightness, shortness of breath and wheezing.   Cardiovascular: Negative for chest pain.  Musculoskeletal: Negative for back pain.  Neurological: Negative for headaches.  Hematological: Negative for adenopathy.       Objective:   Physical Exam  Constitutional: He is oriented to person, place, and time. He appears well-developed and well-nourished. No distress.  HENT:  Head: Normocephalic and atraumatic.  Right Ear: External ear normal.  Left Ear: External ear normal.  Mouth/Throat: Oropharynx is clear and moist. No oropharyngeal exudate.  Eyes: Conjunctivae are normal. Right eye exhibits no discharge. Left eye exhibits no discharge.  Neck: Normal range of motion. Neck supple. No thyromegaly present.  Cardiovascular: Normal rate, regular rhythm and normal heart sounds.   No murmur heard. Pulmonary/Chest: Effort normal and breath sounds normal. No respiratory distress. He has no wheezes. He has no rales.  Lymphadenopathy:    He has no cervical adenopathy.  Neurological: He is alert and oriented to person, place, and time.  Skin: Skin is warm and dry.  Psychiatric: He has a normal mood and affect. His behavior is normal.  Thought content normal.  Vitals reviewed.         Assessment & Plan:   1. Acute upper respiratory infection Symptom management See pt instructions F/u PRN

## 2014-11-03 NOTE — Telephone Encounter (Signed)
Patient was in office today and requested to be put back on Restoril, due to cost. Please advise refill?

## 2014-11-10 ENCOUNTER — Other Ambulatory Visit: Payer: Self-pay | Admitting: Family Medicine

## 2014-11-10 ENCOUNTER — Encounter: Payer: Self-pay | Admitting: Family Medicine

## 2014-11-10 MED ORDER — TAMSULOSIN HCL 0.4 MG PO CAPS: ORAL_CAPSULE | ORAL | Status: DC

## 2014-11-26 ENCOUNTER — Encounter: Payer: Self-pay | Admitting: Family Medicine

## 2014-11-26 ENCOUNTER — Ambulatory Visit (INDEPENDENT_AMBULATORY_CARE_PROVIDER_SITE_OTHER): Payer: Medicare Other | Admitting: Family Medicine

## 2014-11-26 VITALS — BP 111/69 | HR 80 | Temp 98.2°F | Resp 18 | Ht 69.0 in | Wt 221.0 lb

## 2014-11-26 DIAGNOSIS — F419 Anxiety disorder, unspecified: Secondary | ICD-10-CM

## 2014-11-26 DIAGNOSIS — M150 Primary generalized (osteo)arthritis: Secondary | ICD-10-CM

## 2014-11-26 DIAGNOSIS — I10 Essential (primary) hypertension: Secondary | ICD-10-CM

## 2014-11-26 DIAGNOSIS — N184 Chronic kidney disease, stage 4 (severe): Secondary | ICD-10-CM

## 2014-11-26 DIAGNOSIS — N189 Chronic kidney disease, unspecified: Secondary | ICD-10-CM

## 2014-11-26 DIAGNOSIS — R972 Elevated prostate specific antigen [PSA]: Secondary | ICD-10-CM

## 2014-11-26 DIAGNOSIS — M159 Polyosteoarthritis, unspecified: Secondary | ICD-10-CM

## 2014-11-26 MED ORDER — HYDROCODONE-ACETAMINOPHEN 10-325 MG PO TABS: ORAL_TABLET | ORAL | Status: DC

## 2014-11-26 NOTE — Progress Notes (Signed)
OFFICE NOTE  11/26/2014  CC:  Chief Complaint  Patient presents with  . Follow-up    not fasting   HPI: Patient is a 81 y.o. Caucasian male who is here for 6 mo f/u HTN, hyperlipidemia, osteoarthritis of L spine and knees, CRI stage IV, and chronic anxiety.  He followed up with his nephrologist 11/13/14 and all was stable: GFR 21 ml/min, otherwise CMET wnl.  CBC normal except Hb 12.3, normal iron studies: consistent w/pt's known dx of anemia of CRI.  His PSA at our last visit was up a little and I recommended he f/u with his urologist to discuss this but he hasn't done this yet---he does plan on it.    Uses voltaren gel on right thumb and right knee: he limits dose to 8g per day.  Takes 1/2 of norco 10/325 tab 1-2 time per day only occasional days: asks for RF of this med today.   Pertinent PMH:  Past medical, surgical, social, and family history reviewed and no changes are noted since last office visit.  MEDS:  Outpatient Prescriptions Prior to Visit  Medication Sig Dispense Refill  . ALPRAZolam (XANAX) 0.5 MG tablet 1 tab qid prn 120 tablet 5  . aspirin 81 MG tablet Take 81 mg by mouth daily.      . calcitRIOL (ROCALTROL) 0.25 MCG capsule Take 1 capsule by mouth Every morning.    . clotrimazole-betamethasone (LOTRISONE) cream Apply 1 application topically 2 (two) times daily. 45 g 1  . cyclobenzaprine (FLEXERIL) 10 MG tablet take 1 tablet by mouth every morning ,MID AFTERNOON and at bedtime if needed for muscle spasm 90 tablet 5  . diclofenac sodium (VOLTAREN) 1 % GEL Apply 2 g topically 4 (four) times daily. 100 g 5  . diphenoxylate-atropine (LOMOTIL) 2.5-0.025 MG per tablet take 1 or 2 tablets by mouth four times a day if needed to PREVENT diarrhea 100 tablet 5  . fluticasone (FLONASE) 50 MCG/ACT nasal spray 2 sprays each nostril qd 48 g 0  . HYDROcodone-acetaminophen (NORCO) 10-325 MG per tablet take 1/2 to 1 tablet by mouth every 6 hours if needed 100 tablet 0  . ketoconazole  (NIZORAL) 2 % shampoo Apply to affected area three times per week as needed 240 mL 6  . Ketotifen Fumarate (ZADITOR OP) Apply 0.035 % to eye.    . losartan (COZAAR) 50 MG tablet take 1 tablet by mouth once daily 90 tablet 1  . prochlorperazine (COMPAZINE) 10 MG tablet Take 1 tablet (10 mg total) by mouth every 6 (six) hours as needed for nausea or vomiting. 30 tablet 5  . simvastatin (ZOCOR) 40 MG tablet take 1 tablet by mouth at bedtime 90 tablet 1  . tamsulosin (FLOMAX) 0.4 MG CAPS capsule take 1 capsule by mouth at bedtime 90 capsule 1  . temazepam (RESTORIL) 15 MG capsule Take 1 capsule (15 mg total) by mouth at bedtime as needed for sleep. 30 capsule 5  . valACYclovir (VALTREX) 1000 MG tablet take 2 tablets by mouth IMMEDIATELY AT ONSET OF LESION. REPEAT IN 12 HOURS (Patient not taking: Reported on 11/26/2014) 30 tablet 1  . omeprazole (PRILOSEC) 20 MG capsule take 1 capsule by mouth twice a day (Patient not taking: Reported on 11/03/2014) 180 capsule 3   No facility-administered medications prior to visit.    PE: Blood pressure 111/69, temperature 98.2 F (36.8 C), temperature source Temporal, resp. rate 18, height 5\' 9"  (1.753 m), weight 221 lb (100.245 kg). Gen: Alert, well appearing.  Patient is oriented to person, place, time, and situation. CV: RRR, no m/r/g.   LUNGS: CTA bilat, nonlabored resps, good aeration in all lung fields. EXT: trace pitting edema in R LL > left LL.  IMPRESSION AND PLAN:  1) HTN; The current medical regimen is effective;  continue present plan and medications. Lytes/cr UTD via nephrologist's testing recently.  2) Hyperlipidemia: The current medical regimen is effective;  continue present plan and medications. FLP 6 mo ago fine.  Will repeat at next f/u in 70mo.  Tolerating statin.  3) CRI stage IV: stable, continue routine f/u with nephrologist, limit/avoid nephrotoxins esp oral NSAIDs.  4) Osteoarthritis: R thumb, knees, L back.   Getting by with limited  use of voltaren gel + Norco 10/325: RF of norco rx handed to pt today.  5) Elevated PSA: he has long hx of this.  I recommended once again he f/u with his urologist and he said he plans on doing this soon.  6) chronic anxiety: The current medical regimen is effective;  continue present plan and medications.  An After Visit Summary was printed and given to the patient.  FOLLOW UP: 6 mo 30 min fasting f/u visit

## 2015-04-04 ENCOUNTER — Other Ambulatory Visit: Payer: Self-pay | Admitting: *Deleted

## 2015-04-04 MED ORDER — HYDROCODONE-ACETAMINOPHEN 10-325 MG PO TABS: ORAL_TABLET | ORAL | Status: DC

## 2015-04-04 NOTE — Telephone Encounter (Signed)
Pt called requesting refill for Hydrocodone/APAP. LOV 11/26/14, NOV 05/24/15, last written: 11/26/14 #100 0RF. Please advise. Thanks.

## 2015-05-10 ENCOUNTER — Other Ambulatory Visit: Payer: Self-pay | Admitting: *Deleted

## 2015-05-10 MED ORDER — SIMVASTATIN 40 MG PO TABS: ORAL_TABLET | ORAL | Status: AC

## 2015-05-10 MED ORDER — LOSARTAN POTASSIUM 50 MG PO TABS: ORAL_TABLET | ORAL | Status: AC

## 2015-05-10 NOTE — Addendum Note (Signed)
Addended by: Lanae Crumbly on: 05/10/2015 04:54 PM   Modules accepted: Orders

## 2015-05-10 NOTE — Telephone Encounter (Addendum)
RF request for simvastatin and losartan.  LOV: 11/26/14 Next ov: 05/24/15 Last written: 10/19/14 #90 w/ 1RF, same

## 2015-05-24 ENCOUNTER — Ambulatory Visit (INDEPENDENT_AMBULATORY_CARE_PROVIDER_SITE_OTHER): Payer: Medicare Other | Admitting: Family Medicine

## 2015-05-24 ENCOUNTER — Encounter: Payer: Self-pay | Admitting: Family Medicine

## 2015-05-24 VITALS — BP 117/77 | HR 73 | Temp 97.2°F | Resp 16 | Ht 69.0 in | Wt 218.0 lb

## 2015-05-24 DIAGNOSIS — R972 Elevated prostate specific antigen [PSA]: Secondary | ICD-10-CM

## 2015-05-24 DIAGNOSIS — M159 Polyosteoarthritis, unspecified: Secondary | ICD-10-CM

## 2015-05-24 DIAGNOSIS — M150 Primary generalized (osteo)arthritis: Secondary | ICD-10-CM | POA: Diagnosis not present

## 2015-05-24 DIAGNOSIS — E785 Hyperlipidemia, unspecified: Secondary | ICD-10-CM | POA: Diagnosis not present

## 2015-05-24 DIAGNOSIS — I10 Essential (primary) hypertension: Secondary | ICD-10-CM

## 2015-05-24 DIAGNOSIS — N189 Chronic kidney disease, unspecified: Secondary | ICD-10-CM

## 2015-05-24 DIAGNOSIS — N400 Enlarged prostate without lower urinary tract symptoms: Secondary | ICD-10-CM

## 2015-05-24 DIAGNOSIS — N184 Chronic kidney disease, stage 4 (severe): Secondary | ICD-10-CM

## 2015-05-24 LAB — LIPID PANEL
CHOLESTEROL: 182 mg/dL (ref 0–200)
HDL: 50.5 mg/dL (ref >39.00)
LDL Cholesterol: 113 mg/dL — ABNORMAL HIGH (ref 0–99)
NonHDL: 131.5
Total CHOL/HDL Ratio: 4
Triglycerides: 95 mg/dL (ref 0.0–149.0)
VLDL: 19 mg/dL (ref 0.0–40.0)

## 2015-05-24 NOTE — Progress Notes (Signed)
OFFICE VISIT  05/24/2015   CC:  Chief Complaint  Patient presents with  . Follow-up    6 month f/u. Pt is fasting.    HPI:    Patient is a 81 y.o. Caucasian male who presents for 1 yr f/u HTN, hyperlipidemia, Hypertensive chronic kidney dz (stage 4), chronic osteoarthritic pain in back and knees. He sees his nephrologist fairly regularly and gets some labs through him.  April 2016 Cr stable at 2.73.  Hb stable, iron testing normal.  Hepatic panel normal.  Last visit 1 yr ago we did his "final" PSA/DRE testing.  However, it was up a little and he was supposed to have followed up with his urologist but he admits he did not.  He is playing golf.  Pain in knees is tolerable with regular use of voltaren gel, prn use of vicodin.  Avoids oral NSAIDs due to renal insufficiency. No home bp monitoring.  Has been eating wt watchers meals more lately.  Past Medical History  Diagnosis Date  . Arthritis     Knees and low back  . Anxiety   . GERD (gastroesophageal reflux disease)   . Hyperlipidemia   . Anemia of chronic renal failure   . Chronic renal insufficiency, stage IV (severe) 2014/15    CrCl in the 20s as of 02/2014.  Kidney dz secondary to HTN, chronic NSAIDs (excessive kidney scarring noted)--Dr. Deterding.  Cr 2.2-2.6 range.  Marland Kitchen BPH with elevated PSA   . Hypertensive chronic kidney disease   . Colon polyp 2005    Not sent to path; repeat recommended 5 yrs, so repeat colonoscopy 03/2010 was normal  . Palpitations     Worked up by Dr. Gwenlyn Found (myoview neg, echo normal, 75mo event monitor unremarkable: no new  meds were recommended.  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 Essential hypertension   . Hard of hearing   . Seasonal allergic rhinitis     Past Surgical History  Procedure Laterality Date  . Transthoracic echocardiogram  11/2010    Concentric LVH, diastolic dysfunction.  EF normal.  Mild mitral regurg.  . Acromioplasty  04/03/02    Left, with repair of  complete rotator cuff repair  . Quadriceps tendon repair  2002    Both knees, right most recent  . Lumbar laminectomy  2001    L4  . Knee surgery      Bilateral    Outpatient Prescriptions Prior to Visit  Medication Sig Dispense Refill  . ALPRAZolam (XANAX) 0.5 MG tablet 1 tab qid prn 120 tablet 5  . aspirin 81 MG tablet Take 81 mg by mouth daily.      . calcitRIOL (ROCALTROL) 0.25 MCG capsule Take 1 capsule by mouth Every morning.    . clotrimazole-betamethasone (LOTRISONE) cream Apply 1 application topically 2 (two) times daily. 45 g 1  . diclofenac sodium (VOLTAREN) 1 % GEL Apply 2 g topically 4 (four) times daily. 100 g 5  . diphenoxylate-atropine (LOMOTIL) 2.5-0.025 MG per tablet take 1 or 2 tablets by mouth four times a day if needed to PREVENT diarrhea 100 tablet 5  . fluticasone (FLONASE) 50 MCG/ACT nasal spray 2 sprays each nostril qd 48 g 0  . HYDROcodone-acetaminophen (NORCO) 10-325 MG per tablet take 1/2 to 1 tablet by mouth every 6 hours if needed 100 tablet 0  . ketoconazole (NIZORAL) 2 % shampoo Apply to affected area three times per week as needed 240 mL 6  .  Ketotifen Fumarate (ZADITOR OP) Apply 0.035 % to eye.    . losartan (COZAAR) 50 MG tablet take 1 tablet by mouth once daily 90 tablet 1  . prochlorperazine (COMPAZINE) 10 MG tablet Take 1 tablet (10 mg total) by mouth every 6 (six) hours as needed for nausea or vomiting. 30 tablet 5  . simvastatin (ZOCOR) 40 MG tablet take 1 tablet by mouth at bedtime 90 tablet 1  . tamsulosin (FLOMAX) 0.4 MG CAPS capsule take 1 capsule by mouth at bedtime 90 capsule 1  . temazepam (RESTORIL) 15 MG capsule Take 1 capsule (15 mg total) by mouth at bedtime as needed for sleep. 30 capsule 5  . valACYclovir (VALTREX) 1000 MG tablet take 2 tablets by mouth IMMEDIATELY AT ONSET OF LESION. REPEAT IN 12 HOURS 30 tablet 1  . cyclobenzaprine (FLEXERIL) 10 MG tablet take 1 tablet by mouth every morning ,MID AFTERNOON and at bedtime if needed for  muscle spasm (Patient not taking: Reported on 05/24/2015) 90 tablet 5   No facility-administered medications prior to visit.    No Known Allergies  ROS As per HPI  PE: Blood pressure 117/77, pulse 73, temperature 97.2 F (36.2 C), temperature source Oral, resp. rate 16, height 5\' 9"  (1.753 m), weight 218 lb (98.884 kg), SpO2 96 %. CV: RRR, no m/r/g.   LUNGS: CTA bilat, nonlabored resps, good aeration in all lung fields. EXT: no clubbing, cyanosis, or edema.   LABS:  Lab Results  Component Value Date   TSH 1.21 04/17/2011   Lab Results  Component Value Date   WBC 6.5 04/17/2011   HGB 10.6 07/04/2011   HCT 29.1* 04/17/2011   MCV 92.4 04/17/2011   PLT 232.0 04/17/2011   Lab Results  Component Value Date   CREATININE 2.5* 12/18/2012   BUN 30* 12/18/2012   NA 141 12/18/2012   K 4.4 12/18/2012   CL 108 12/18/2012   CO2 27 12/18/2012   Lab Results  Component Value Date   ALT 16 06/19/2012   AST 17 06/19/2012   ALKPHOS 56 06/19/2012   BILITOT 0.6 06/19/2012   Lab Results  Component Value Date   CHOL 183 05/26/2014   Lab Results  Component Value Date   HDL 47.10 05/26/2014   Lab Results  Component Value Date   LDLCALC 119* 05/26/2014   Lab Results  Component Value Date   TRIG 87.0 05/26/2014   Lab Results  Component Value Date   CHOLHDL 4 05/26/2014   Lab Results  Component Value Date   PSA 7.26* 05/26/2014   PSA 5.34* 11/24/2012   PSA 4.96* 04/17/2011    IMPRESSION AND PLAN:  1) Hyperlipidemia: The current medical regimen is effective;  continue present plan and medications.  Recent AST/ALT a few months ago wnl.  Tolerating statin. Needs FLP today.  2) HTN:The current medical regimen is effective;  continue present plan and medications.  3) Chronic osteoarthritis pain: voltaren gel to knees regularly, vicodin occasionally.  No RF's needed today.  4) Insomnia: doing well on restoril.  5) CRI; stable.  BP control good, avoid oral NSAIDs.   Continue ARB.  Keep approp f/u with Dr. Jimmy Footman.  6) Elevated PSA with hx of BPH: he did not f/u with urologist after this bumped up last year.  We'll repeat this today, see urol if increasing.  An After Visit Summary was printed and given to the patient.  FOLLOW UP: Return in about 6 months (around 11/24/2015) for annual medicare wellness visit.

## 2015-05-24 NOTE — Patient Instructions (Signed)
HAVE A GREAT TIME IN [place]!

## 2015-05-24 NOTE — Progress Notes (Signed)
Pre visit review using our clinic review tool, if applicable. No additional management support is needed unless otherwise documented below in the visit note. 

## 2015-05-25 LAB — PSA, TOTAL AND FREE
PSA, Free Pct: 26 % (ref >25)
PSA, Free: 1.84 ng/mL
PSA: 7.05 ng/mL — ABNORMAL HIGH (ref <=4.00)

## 2015-05-30 ENCOUNTER — Other Ambulatory Visit: Payer: Self-pay | Admitting: *Deleted

## 2015-05-30 DIAGNOSIS — C719 Malignant neoplasm of brain, unspecified: Secondary | ICD-10-CM

## 2015-05-30 HISTORY — PX: TRANSTHORACIC ECHOCARDIOGRAM: SHX275

## 2015-05-30 HISTORY — DX: Malignant neoplasm of brain, unspecified: C71.9

## 2015-05-30 NOTE — Telephone Encounter (Signed)
RF request for prochlorperazine LOV: 05/24/15 Next ov: 11/22/15 Last written: 03/24/14 #30 w/ 5RF Please advise. Thanks.

## 2015-05-31 MED ORDER — PROCHLORPERAZINE MALEATE 10 MG PO TABS: 10.0000 mg | ORAL_TABLET | Freq: Four times a day (QID) | ORAL | Status: DC | PRN

## 2015-06-06 ENCOUNTER — Other Ambulatory Visit: Payer: Self-pay | Admitting: *Deleted

## 2015-06-06 MED ORDER — TEMAZEPAM 15 MG PO CAPS: 15.0000 mg | ORAL_CAPSULE | Freq: Every evening | ORAL | Status: DC | PRN

## 2015-06-06 NOTE — Telephone Encounter (Signed)
RF request for temazepam LOV: 05/24/15 Next ov: 11/22/15 Last written: 11/03/14 #60 w/ 5RF Please advise. Thanks.

## 2015-06-06 NOTE — Telephone Encounter (Signed)
Rx faxed

## 2015-06-13 ENCOUNTER — Other Ambulatory Visit: Payer: Self-pay | Admitting: Nephrology

## 2015-06-13 DIAGNOSIS — IMO0002 Reserved for concepts with insufficient information to code with codable children: Secondary | ICD-10-CM

## 2015-06-14 ENCOUNTER — Ambulatory Visit
Admission: RE | Admit: 2015-06-14 | Discharge: 2015-06-14 | Disposition: A | Discharge status: Home / Self Care | Payer: Medicare Other | Source: Ambulatory Visit | Attending: Nephrology | Admitting: Nephrology

## 2015-06-14 ENCOUNTER — Telehealth: Payer: Self-pay | Admitting: Family Medicine

## 2015-06-14 ENCOUNTER — Other Ambulatory Visit: Payer: Self-pay | Admitting: Family Medicine

## 2015-06-14 ENCOUNTER — Other Ambulatory Visit (HOSPITAL_COMMUNITY): Payer: Self-pay | Admitting: Nephrology

## 2015-06-14 DIAGNOSIS — N184 Chronic kidney disease, stage 4 (severe): Secondary | ICD-10-CM

## 2015-06-14 DIAGNOSIS — IMO0002 Reserved for concepts with insufficient information to code with codable children: Secondary | ICD-10-CM

## 2015-06-14 DIAGNOSIS — E211 Secondary hyperparathyroidism, not elsewhere classified: Secondary | ICD-10-CM

## 2015-06-14 DIAGNOSIS — I639 Cerebral infarction, unspecified: Secondary | ICD-10-CM

## 2015-06-14 HISTORY — PX: OTHER SURGICAL HISTORY: SHX169

## 2015-06-14 NOTE — Telephone Encounter (Signed)
Left message for pt to call back  °

## 2015-06-14 NOTE — Telephone Encounter (Signed)
Patient's wife called to advise patient had a slight stroke. Patient was actually having symptoms on Sunday 06/12/15 but refused to go to ER. Patient had an appointment with kidney doctor,  Dr. Jimmy Footman, on 06/13/15. Dr. Jimmy Footman has set up CT scan & US carotid . Dr. Jimmy Footman told patient to make an appointment with PCP but patient is unsure why since he has been scheduled for the tests by Dr. Jimmy Footman. Please advise.

## 2015-06-14 NOTE — Telephone Encounter (Signed)
Please advise. Thanks.  

## 2015-06-14 NOTE — Telephone Encounter (Signed)
Pls notify wife I added a test, an echocardiogram, to the ones Dr. Jimmy Footman ordered. I would like to check on him later this week or early next week to go over results and plan (30 min o/v). -thx

## 2015-06-15 ENCOUNTER — Other Ambulatory Visit: Payer: Self-pay | Admitting: Family Medicine

## 2015-06-15 ENCOUNTER — Ambulatory Visit
Admission: RE | Admit: 2015-06-15 | Discharge: 2015-06-15 | Disposition: A | Discharge status: Home / Self Care | Payer: Medicare Other | Source: Ambulatory Visit | Attending: Family Medicine | Admitting: Family Medicine

## 2015-06-15 ENCOUNTER — Other Ambulatory Visit: Payer: Self-pay

## 2015-06-15 ENCOUNTER — Ambulatory Visit (HOSPITAL_BASED_OUTPATIENT_CLINIC_OR_DEPARTMENT_OTHER): Payer: Medicare Other

## 2015-06-15 ENCOUNTER — Telehealth: Payer: Self-pay | Admitting: Family Medicine

## 2015-06-15 DIAGNOSIS — I340 Nonrheumatic mitral (valve) insufficiency: Secondary | ICD-10-CM | POA: Insufficient documentation

## 2015-06-15 DIAGNOSIS — N189 Chronic kidney disease, unspecified: Secondary | ICD-10-CM

## 2015-06-15 DIAGNOSIS — E785 Hyperlipidemia, unspecified: Secondary | ICD-10-CM | POA: Insufficient documentation

## 2015-06-15 DIAGNOSIS — I10 Essential (primary) hypertension: Secondary | ICD-10-CM

## 2015-06-15 DIAGNOSIS — I639 Cerebral infarction, unspecified: Secondary | ICD-10-CM

## 2015-06-15 DIAGNOSIS — E211 Secondary hyperparathyroidism, not elsewhere classified: Secondary | ICD-10-CM

## 2015-06-15 DIAGNOSIS — I517 Cardiomegaly: Secondary | ICD-10-CM

## 2015-06-15 DIAGNOSIS — I69320 Aphasia following cerebral infarction: Secondary | ICD-10-CM

## 2015-06-15 DIAGNOSIS — I071 Rheumatic tricuspid insufficiency: Secondary | ICD-10-CM | POA: Insufficient documentation

## 2015-06-15 DIAGNOSIS — N2581 Secondary hyperparathyroidism of renal origin: Secondary | ICD-10-CM | POA: Diagnosis not present

## 2015-06-15 DIAGNOSIS — G939 Disorder of brain, unspecified: Secondary | ICD-10-CM

## 2015-06-15 DIAGNOSIS — I129 Hypertensive chronic kidney disease with stage 1 through stage 4 chronic kidney disease, or unspecified chronic kidney disease: Secondary | ICD-10-CM | POA: Insufficient documentation

## 2015-06-15 DIAGNOSIS — G9340 Encephalopathy, unspecified: Secondary | ICD-10-CM | POA: Diagnosis not present

## 2015-06-15 DIAGNOSIS — N184 Chronic kidney disease, stage 4 (severe): Secondary | ICD-10-CM

## 2015-06-15 NOTE — Telephone Encounter (Signed)
Results of recent tests: carotid dopplers normal.  Noncontrast CT brain: large region of edema in entire L temporal lobe with some mass effect on lateral ventricle and midbrain.  Increased intensity at the tip of the temporal lobe may represent focal parenchymal hemorrhage versus density due to an underlying mass.  Talked to pt/wife, recommended MRI brain w/out contrast (CRI, CrCl <30 ml/min). Also recommended neuro referral. Pt/wife agreed with plan.

## 2015-06-15 NOTE — Telephone Encounter (Signed)
Patient has scheduled an appt 06/17/15

## 2015-06-16 ENCOUNTER — Emergency Department (HOSPITAL_COMMUNITY): Payer: Medicare Other

## 2015-06-16 ENCOUNTER — Encounter: Payer: Self-pay | Admitting: Family Medicine

## 2015-06-16 ENCOUNTER — Inpatient Hospital Stay (HOSPITAL_COMMUNITY)
Admission: EM | Admit: 2015-06-16 | Discharge: 2015-06-18 | DRG: 070 | Disposition: A | Discharge status: Home / Self Care | Payer: Medicare Other | Attending: Internal Medicine | Admitting: Internal Medicine

## 2015-06-16 ENCOUNTER — Telehealth: Payer: Self-pay | Admitting: *Deleted

## 2015-06-16 ENCOUNTER — Other Ambulatory Visit: Payer: Self-pay | Admitting: Family Medicine

## 2015-06-16 ENCOUNTER — Other Ambulatory Visit: Payer: Self-pay

## 2015-06-16 DIAGNOSIS — K219 Gastro-esophageal reflux disease without esophagitis: Secondary | ICD-10-CM | POA: Diagnosis present

## 2015-06-16 DIAGNOSIS — Z7982 Long term (current) use of aspirin: Secondary | ICD-10-CM

## 2015-06-16 DIAGNOSIS — G939 Disorder of brain, unspecified: Secondary | ICD-10-CM | POA: Diagnosis present

## 2015-06-16 DIAGNOSIS — G936 Cerebral edema: Secondary | ICD-10-CM | POA: Diagnosis present

## 2015-06-16 DIAGNOSIS — Z66 Do not resuscitate: Secondary | ICD-10-CM | POA: Diagnosis present

## 2015-06-16 DIAGNOSIS — N179 Acute kidney failure, unspecified: Secondary | ICD-10-CM | POA: Diagnosis present

## 2015-06-16 DIAGNOSIS — D631 Anemia in chronic kidney disease: Secondary | ICD-10-CM | POA: Diagnosis present

## 2015-06-16 DIAGNOSIS — N184 Chronic kidney disease, stage 4 (severe): Secondary | ICD-10-CM | POA: Diagnosis present

## 2015-06-16 DIAGNOSIS — G9340 Encephalopathy, unspecified: Secondary | ICD-10-CM | POA: Diagnosis present

## 2015-06-16 DIAGNOSIS — Z96612 Presence of left artificial shoulder joint: Secondary | ICD-10-CM | POA: Diagnosis present

## 2015-06-16 DIAGNOSIS — R4701 Aphasia: Secondary | ICD-10-CM | POA: Diagnosis present

## 2015-06-16 DIAGNOSIS — H9190 Unspecified hearing loss, unspecified ear: Secondary | ICD-10-CM | POA: Diagnosis present

## 2015-06-16 DIAGNOSIS — F419 Anxiety disorder, unspecified: Secondary | ICD-10-CM | POA: Diagnosis present

## 2015-06-16 DIAGNOSIS — J302 Other seasonal allergic rhinitis: Secondary | ICD-10-CM | POA: Diagnosis present

## 2015-06-16 DIAGNOSIS — R440 Auditory hallucinations: Secondary | ICD-10-CM | POA: Diagnosis present

## 2015-06-16 DIAGNOSIS — Z8249 Family history of ischemic heart disease and other diseases of the circulatory system: Secondary | ICD-10-CM

## 2015-06-16 DIAGNOSIS — R443 Hallucinations, unspecified: Secondary | ICD-10-CM | POA: Diagnosis present

## 2015-06-16 DIAGNOSIS — Z9889 Other specified postprocedural states: Secondary | ICD-10-CM

## 2015-06-16 DIAGNOSIS — E785 Hyperlipidemia, unspecified: Secondary | ICD-10-CM | POA: Diagnosis present

## 2015-06-16 DIAGNOSIS — G9389 Other specified disorders of brain: Secondary | ICD-10-CM | POA: Diagnosis present

## 2015-06-16 DIAGNOSIS — R4182 Altered mental status, unspecified: Secondary | ICD-10-CM

## 2015-06-16 DIAGNOSIS — R29810 Facial weakness: Secondary | ICD-10-CM | POA: Diagnosis present

## 2015-06-16 DIAGNOSIS — N400 Enlarged prostate without lower urinary tract symptoms: Secondary | ICD-10-CM | POA: Diagnosis present

## 2015-06-16 DIAGNOSIS — M1990 Unspecified osteoarthritis, unspecified site: Secondary | ICD-10-CM | POA: Diagnosis present

## 2015-06-16 DIAGNOSIS — Z86010 Personal history of colonic polyps: Secondary | ICD-10-CM

## 2015-06-16 DIAGNOSIS — R441 Visual hallucinations: Secondary | ICD-10-CM | POA: Diagnosis present

## 2015-06-16 DIAGNOSIS — Z79899 Other long term (current) drug therapy: Secondary | ICD-10-CM

## 2015-06-16 DIAGNOSIS — I129 Hypertensive chronic kidney disease with stage 1 through stage 4 chronic kidney disease, or unspecified chronic kidney disease: Secondary | ICD-10-CM | POA: Diagnosis present

## 2015-06-16 LAB — COMPREHENSIVE METABOLIC PANEL
ALBUMIN: 3.8 g/dL (ref 3.5–5.0)
ALK PHOS: 45 U/L (ref 38–126)
ALT: 15 U/L — ABNORMAL LOW (ref 17–63)
ANION GAP: 8 (ref 5–15)
AST: 18 U/L (ref 15–41)
BILIRUBIN TOTAL: 0.6 mg/dL (ref 0.3–1.2)
BUN: 33 mg/dL — AB (ref 6–20)
CALCIUM: 8.7 mg/dL — AB (ref 8.9–10.3)
CO2: 26 mmol/L (ref 22–32)
CREATININE: 2.8 mg/dL — AB (ref 0.61–1.24)
Chloride: 104 mmol/L (ref 101–111)
GFR calc Af Amer: 23 mL/min — ABNORMAL LOW (ref >60)
GFR calc non Af Amer: 20 mL/min — ABNORMAL LOW (ref >60)
GLUCOSE: 143 mg/dL — AB (ref 65–99)
Potassium: 4 mmol/L (ref 3.5–5.1)
SODIUM: 138 mmol/L (ref 135–145)
TOTAL PROTEIN: 6.5 g/dL (ref 6.5–8.1)

## 2015-06-16 LAB — URINALYSIS, ROUTINE W REFLEX MICROSCOPIC
BILIRUBIN URINE: NEGATIVE
GLUCOSE, UA: 250 mg/dL — AB
KETONES UR: NEGATIVE mg/dL
Leukocytes, UA: NEGATIVE
Nitrite: NEGATIVE
PH: 5 (ref 5.0–8.0)
Protein, ur: 30 mg/dL — AB
Specific Gravity, Urine: 1.016 (ref 1.005–1.030)
Urobilinogen, UA: 0.2 mg/dL (ref 0.0–1.0)

## 2015-06-16 LAB — CBC
HCT: 34.4 % — ABNORMAL LOW (ref 39.0–52.0)
HEMOGLOBIN: 11.6 g/dL — AB (ref 13.0–17.0)
MCH: 30.6 pg (ref 26.0–34.0)
MCHC: 33.7 g/dL (ref 30.0–36.0)
MCV: 90.8 fL (ref 78.0–100.0)
PLATELETS: 218 10*3/uL (ref 150–400)
RBC: 3.79 MIL/uL — ABNORMAL LOW (ref 4.22–5.81)
RDW: 12.8 % (ref 11.5–15.5)
WBC: 11.5 10*3/uL — ABNORMAL HIGH (ref 4.0–10.5)

## 2015-06-16 LAB — CBG MONITORING, ED: GLUCOSE-CAPILLARY: 141 mg/dL — AB (ref 65–99)

## 2015-06-16 LAB — URINE MICROSCOPIC-ADD ON

## 2015-06-16 LAB — I-STAT CG4 LACTIC ACID, ED
LACTIC ACID, VENOUS: 1.24 mmol/L (ref 0.5–2.0)
LACTIC ACID, VENOUS: 1.85 mmol/L (ref 0.5–2.0)

## 2015-06-16 NOTE — ED Notes (Signed)
Pt reports cough

## 2015-06-16 NOTE — ED Notes (Signed)
Checked patient blood sugar it was 141 notified RN Sabrina of blood sugar

## 2015-06-16 NOTE — ED Notes (Signed)
Pt is here with confusion and wife reports the confusion started on Sunday.  The wife was told that he had aphasia, has had CEA, CT, and MRI.  Pt cannot have dye for the CT or MRI so they could not diagnose a stroke or tumor.  Pt sent here.  Pt is alert.  Pt follows commands and appears to have left facial droop but it goes away with smiling.  Pt states he was shaking and that he needed to come to the ED.

## 2015-06-16 NOTE — Telephone Encounter (Signed)
Chrys Racer at [facility] called with call report for pt.   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 Encephalitis is an addition consideration correlate with clinical presentation and conceder neurosurgical referral and biopsy.

## 2015-06-17 ENCOUNTER — Encounter: Payer: Self-pay | Admitting: Family Medicine

## 2015-06-17 ENCOUNTER — Inpatient Hospital Stay (HOSPITAL_COMMUNITY): Payer: Medicare Other

## 2015-06-17 ENCOUNTER — Ambulatory Visit: Payer: Medicare Other | Admitting: Family Medicine

## 2015-06-17 DIAGNOSIS — N400 Enlarged prostate without lower urinary tract symptoms: Secondary | ICD-10-CM | POA: Diagnosis present

## 2015-06-17 DIAGNOSIS — N184 Chronic kidney disease, stage 4 (severe): Secondary | ICD-10-CM | POA: Diagnosis present

## 2015-06-17 DIAGNOSIS — N179 Acute kidney failure, unspecified: Secondary | ICD-10-CM | POA: Diagnosis present

## 2015-06-17 DIAGNOSIS — R441 Visual hallucinations: Secondary | ICD-10-CM | POA: Diagnosis present

## 2015-06-17 DIAGNOSIS — M1990 Unspecified osteoarthritis, unspecified site: Secondary | ICD-10-CM | POA: Diagnosis present

## 2015-06-17 DIAGNOSIS — R4701 Aphasia: Secondary | ICD-10-CM | POA: Diagnosis present

## 2015-06-17 DIAGNOSIS — J302 Other seasonal allergic rhinitis: Secondary | ICD-10-CM | POA: Diagnosis present

## 2015-06-17 DIAGNOSIS — Z9889 Other specified postprocedural states: Secondary | ICD-10-CM | POA: Diagnosis not present

## 2015-06-17 DIAGNOSIS — D631 Anemia in chronic kidney disease: Secondary | ICD-10-CM | POA: Diagnosis present

## 2015-06-17 DIAGNOSIS — R29810 Facial weakness: Secondary | ICD-10-CM | POA: Diagnosis present

## 2015-06-17 DIAGNOSIS — Z7982 Long term (current) use of aspirin: Secondary | ICD-10-CM | POA: Diagnosis not present

## 2015-06-17 DIAGNOSIS — R443 Hallucinations, unspecified: Secondary | ICD-10-CM | POA: Diagnosis present

## 2015-06-17 DIAGNOSIS — K219 Gastro-esophageal reflux disease without esophagitis: Secondary | ICD-10-CM | POA: Diagnosis present

## 2015-06-17 DIAGNOSIS — Z79899 Other long term (current) drug therapy: Secondary | ICD-10-CM | POA: Diagnosis not present

## 2015-06-17 DIAGNOSIS — G936 Cerebral edema: Secondary | ICD-10-CM | POA: Diagnosis present

## 2015-06-17 DIAGNOSIS — E785 Hyperlipidemia, unspecified: Secondary | ICD-10-CM | POA: Diagnosis present

## 2015-06-17 DIAGNOSIS — R440 Auditory hallucinations: Secondary | ICD-10-CM | POA: Diagnosis present

## 2015-06-17 DIAGNOSIS — G9389 Other specified disorders of brain: Secondary | ICD-10-CM | POA: Diagnosis present

## 2015-06-17 DIAGNOSIS — Z8249 Family history of ischemic heart disease and other diseases of the circulatory system: Secondary | ICD-10-CM | POA: Diagnosis not present

## 2015-06-17 DIAGNOSIS — I129 Hypertensive chronic kidney disease with stage 1 through stage 4 chronic kidney disease, or unspecified chronic kidney disease: Secondary | ICD-10-CM | POA: Diagnosis present

## 2015-06-17 DIAGNOSIS — Z86010 Personal history of colonic polyps: Secondary | ICD-10-CM | POA: Diagnosis not present

## 2015-06-17 DIAGNOSIS — G9340 Encephalopathy, unspecified: Secondary | ICD-10-CM | POA: Diagnosis present

## 2015-06-17 DIAGNOSIS — F419 Anxiety disorder, unspecified: Secondary | ICD-10-CM | POA: Diagnosis present

## 2015-06-17 DIAGNOSIS — H9190 Unspecified hearing loss, unspecified ear: Secondary | ICD-10-CM | POA: Diagnosis present

## 2015-06-17 DIAGNOSIS — Z66 Do not resuscitate: Secondary | ICD-10-CM | POA: Diagnosis present

## 2015-06-17 DIAGNOSIS — G939 Disorder of brain, unspecified: Secondary | ICD-10-CM | POA: Diagnosis present

## 2015-06-17 DIAGNOSIS — Z96612 Presence of left artificial shoulder joint: Secondary | ICD-10-CM | POA: Diagnosis present

## 2015-06-17 LAB — BASIC METABOLIC PANEL
ANION GAP: 7 (ref 5–15)
BUN: 30 mg/dL — ABNORMAL HIGH (ref 6–20)
CALCIUM: 8.2 mg/dL — AB (ref 8.9–10.3)
CO2: 25 mmol/L (ref 22–32)
CREATININE: 2.48 mg/dL — AB (ref 0.61–1.24)
Chloride: 105 mmol/L (ref 101–111)
GFR, EST AFRICAN AMERICAN: 27 mL/min — AB (ref >60)
GFR, EST NON AFRICAN AMERICAN: 23 mL/min — AB (ref >60)
GLUCOSE: 137 mg/dL — AB (ref 65–99)
Potassium: 4 mmol/L (ref 3.5–5.1)
Sodium: 137 mmol/L (ref 135–145)

## 2015-06-17 LAB — CBC
HCT: 32.7 % — ABNORMAL LOW (ref 39.0–52.0)
HEMOGLOBIN: 11.1 g/dL — AB (ref 13.0–17.0)
MCH: 30.4 pg (ref 26.0–34.0)
MCHC: 33.9 g/dL (ref 30.0–36.0)
MCV: 89.6 fL (ref 78.0–100.0)
PLATELETS: 199 10*3/uL (ref 150–400)
RBC: 3.65 MIL/uL — AB (ref 4.22–5.81)
RDW: 12.7 % (ref 11.5–15.5)
WBC: 11.7 10*3/uL — ABNORMAL HIGH (ref 4.0–10.5)

## 2015-06-17 MED ORDER — DEXAMETHASONE SODIUM PHOSPHATE 4 MG/ML IJ SOLN
4.0000 mg | Freq: Four times a day (QID) | INTRAMUSCULAR | Status: DC
Start: 2015-06-17 — End: 2015-06-18
  Administered 2015-06-17 – 2015-06-18 (×6): 4 mg via INTRAVENOUS
  Filled 2015-06-17 (×6): qty 1

## 2015-06-17 MED ORDER — SIMVASTATIN 40 MG PO TABS
40.0000 mg | ORAL_TABLET | Freq: Every day | ORAL | Status: DC
  Administered 2015-06-17: 40 mg via ORAL
  Filled 2015-06-17: qty 1

## 2015-06-17 MED ORDER — LORAZEPAM 2 MG/ML IJ SOLN
0.5000 mg | INTRAMUSCULAR | Status: DC | PRN
  Administered 2015-06-17: 0.5 mg via INTRAVENOUS
  Filled 2015-06-17: qty 1

## 2015-06-17 MED ORDER — HEPARIN SODIUM (PORCINE) 5000 UNIT/ML IJ SOLN: 5000.0000 [IU] | Freq: Three times a day (TID) | INTRAMUSCULAR | Status: DC

## 2015-06-17 MED ORDER — ALUM & MAG HYDROXIDE-SIMETH 200-200-20 MG/5ML PO SUSP: 30.0000 mL | Freq: Four times a day (QID) | ORAL | Status: DC | PRN

## 2015-06-17 MED ORDER — SODIUM CHLORIDE 0.9 % IV SOLN: INTRAVENOUS | Status: DC

## 2015-06-17 MED ORDER — HYDRALAZINE HCL 25 MG PO TABS
25.0000 mg | ORAL_TABLET | Freq: Four times a day (QID) | ORAL | Status: DC
  Administered 2015-06-17 – 2015-06-18 (×6): 25 mg via ORAL
  Filled 2015-06-17 (×6): qty 1

## 2015-06-17 MED ORDER — IOHEXOL 300 MG/ML  SOLN
50.0000 mL | Freq: Once | INTRAMUSCULAR | Status: AC | PRN
  Administered 2015-06-17: 50 mL via ORAL

## 2015-06-17 MED ORDER — LEVETIRACETAM 750 MG PO TABS
750.0000 mg | ORAL_TABLET | Freq: Two times a day (BID) | ORAL | Status: DC
  Administered 2015-06-17 – 2015-06-18 (×3): 750 mg via ORAL
  Filled 2015-06-17 (×3): qty 1

## 2015-06-17 MED ORDER — FAMOTIDINE 20 MG PO TABS
20.0000 mg | ORAL_TABLET | Freq: Every day | ORAL | Status: DC
  Administered 2015-06-17 – 2015-06-18 (×2): 20 mg via ORAL
  Filled 2015-06-17 (×2): qty 1

## 2015-06-17 MED ORDER — SODIUM CHLORIDE 0.9 % IV SOLN
Freq: Once | INTRAVENOUS | Status: AC
  Administered 2015-06-17: via INTRAVENOUS

## 2015-06-17 MED ORDER — DICLOFENAC SODIUM 1 % TD GEL
2.0000 g | Freq: Three times a day (TID) | TRANSDERMAL | Status: DC
  Administered 2015-06-17 – 2015-06-18 (×4): 2 g via TOPICAL
  Filled 2015-06-17: qty 100

## 2015-06-17 MED ORDER — ASPIRIN EC 81 MG PO TBEC
81.0000 mg | DELAYED_RELEASE_TABLET | Freq: Every day | ORAL | Status: DC
  Administered 2015-06-17: 81 mg via ORAL
  Filled 2015-06-17: qty 1

## 2015-06-17 MED ORDER — CALCITRIOL 0.25 MCG PO CAPS
0.2500 ug | ORAL_CAPSULE | Freq: Every day | ORAL | Status: DC
  Administered 2015-06-17 – 2015-06-18 (×2): 0.25 ug via ORAL
  Filled 2015-06-17 (×2): qty 1

## 2015-06-17 MED ORDER — LOSARTAN POTASSIUM 50 MG PO TABS
50.0000 mg | ORAL_TABLET | Freq: Every day | ORAL | Status: DC
  Administered 2015-06-17 – 2015-06-18 (×2): 50 mg via ORAL
  Filled 2015-06-17 (×2): qty 1

## 2015-06-17 MED ORDER — ACETAMINOPHEN 650 MG RE SUPP: 650.0000 mg | Freq: Four times a day (QID) | RECTAL | Status: DC | PRN

## 2015-06-17 MED ORDER — ACETAMINOPHEN 325 MG PO TABS
650.0000 mg | ORAL_TABLET | Freq: Four times a day (QID) | ORAL | Status: DC | PRN
Start: 2015-06-17 — End: 2015-06-18

## 2015-06-17 MED ORDER — FLUTICASONE PROPIONATE 50 MCG/ACT NA SUSP
2.0000 | Freq: Every day | NASAL | Status: DC
  Administered 2015-06-17 – 2015-06-18 (×2): 2 via NASAL
  Filled 2015-06-17: qty 16

## 2015-06-17 MED ORDER — TAMSULOSIN HCL 0.4 MG PO CAPS
0.4000 mg | ORAL_CAPSULE | Freq: Every day | ORAL | Status: DC
  Administered 2015-06-17 – 2015-06-18 (×2): 0.4 mg via ORAL
  Filled 2015-06-17 (×2): qty 1

## 2015-06-17 NOTE — Telephone Encounter (Signed)
FYI: Spoke to pts wife and she stated that pt has already had echocardiogram and is now back in the hospital. She had to take him to the ER last night. She stated that they are doing more test and are planning on putting him on a steroid and they stated that the it could be a possible brain tumor.

## 2015-06-17 NOTE — Progress Notes (Addendum)
Patient admitted after midnight- please see H&P.  Plan for possible biopsy on Wednesday.  PT eval-- steroids/keppra added Called wife, no answer Eulogio Bear DO

## 2015-06-17 NOTE — H&P (Addendum)
History and Physical  MAZE CORNIEL WUJ:[id number] DOB: 12-Dec-1932 DOA: 06/16/2015  Referring physician: Dr Vanita Panda, ED physician PCP: Tammi Sou, MD   Chief Complaint: Altered status  HPI: NIVIN BRANIFF is a 81 y.o. male  With a history of GERD, stage IV chronic renal insufficiency, hypertension, arthritis, hyperlipidemia. Patient seen for 4 days of progressive aphasia, auditory and visual hallucinations, with a CT and MRI obtained as an outpatient that is concerning for a brain mass. The patient was brought to the emergency department as his symptoms have been progressing and not improving. There've been no palliating or provoking factors. In discussing with the patient, he states symptoms have started approximately 3 weeks ago but have gradually increased. His wife is only noticed these symptoms since Sunday. Patient saw his kidney doctor earlier this week, who ordered carotid ultrasound, CT of the head. His carotid ultrasound was normal, but the head CT showed abnormal edema involving left temporal lobe and extending into the left occipital and parietal lobes. 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 Due to the auditory and visual hallucinations, both the patient and his wife are comfortable with him staying at home.   Review of Systems:  Pt complains of auditory and visual hallucinations, aphasia.  Pt denies any fevers, chills, nausea, vomiting, diarrhea, constipation, melena, rectal bleeding, night sweats, weight loss, weight gain, vision changes, headache.  Review of systems are otherwise negative  Past Medical History  Diagnosis Date  . Arthritis     Knees and low back  . Anxiety   . GERD (gastroesophageal reflux disease)   . Hyperlipidemia   . Anemia of chronic renal failure   . Chronic renal insufficiency, stage IV (severe) 2014/15    CrCl in the 20s as of 02/2014.  Kidney dz secondary to HTN, chronic NSAIDs  (excessive kidney scarring noted)--Dr. Deterding.  Cr 2.2-2.6 range.  Marland Kitchen BPH with elevated PSA   . Hypertensive chronic kidney disease   . Colon polyp 2005    Not sent to path; repeat recommended 5 yrs, so repeat colonoscopy 03/2010 was normal  . Palpitations     Worked up by Dr. Gwenlyn Found (myoview neg, echo normal, 22mo event monitor unremarkable: no new  meds were recommended.  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 Essential hypertension   . Hard of hearing   . Seasonal allergic rhinitis    Past Surgical History  Procedure Laterality Date  . Transthoracic echocardiogram  11/2010    Concentric LVH, diastolic dysfunction.  EF normal.  Mild mitral regurg.  . Acromioplasty  04/03/02    Left, with repair of complete rotator cuff repair  . Quadriceps tendon repair  2002    Both knees, right most recent  . Lumbar laminectomy  2001    L4  . Knee surgery      Bilateral  . Transthoracic echocardiogram    . Carotid dopplers  06/14/15    No stenosis   Social History:  reports that he has never smoked. He has never used smokeless tobacco. He reports that he does not drink alcohol or use illicit drugs. Patient lives at home & is able to participate in activities of daily living  No Known Allergies  Family History  Problem Relation Age of Onset  . Hyperlipidemia Other   . Hypertension Other   . Heart disease Mother   . Diabetes Mother   . Cancer Father  liver cancer      Prior to Admission medications   Medication Sig Start Date End Date Taking? Authorizing Provider  ALPRAZolam Duanne Moron) 0.5 MG tablet 1 tab qid prn Patient taking differently: Take 0.5 mg by mouth 4 (four) times daily as needed for anxiety or sleep. 1 tab qid prn 06/21/14  Yes Tammi Sou, MD  aspirin 81 MG tablet Take 81 mg by mouth daily.     Yes Historical Provider, MD  calcitRIOL (ROCALTROL) 0.25 MCG capsule Take 1 capsule by mouth Every morning. 09/26/11  Yes Historical Provider, MD    clotrimazole-betamethasone (LOTRISONE) cream Apply 1 application topically 2 (two) times daily. 06/09/14  Yes Tammi Sou, MD  diclofenac sodium (VOLTAREN) 1 % GEL Apply 2 g topically 4 (four) times daily. 11/24/12  Yes Tammi Sou, MD  diphenoxylate-atropine (LOMOTIL) 2.5-0.025 MG per tablet take 1 or 2 tablets by mouth four times a day if needed to PREVENT diarrhea 07/07/11  Yes Hoover Browns., MD  fexofenadine (ALLEGRA) 180 MG tablet Take 180 mg by mouth daily as needed for allergies or rhinitis.   Yes Historical Provider, MD  fluticasone (FLONASE) 50 MCG/ACT nasal spray 2 sprays each nostril qd Patient taking differently: Place 2 sprays into both nostrils daily.  02/23/13  Yes Tammi Sou, MD  HYDROcodone-acetaminophen [facility]) 10-325 MG per tablet take 1/2 to 1 tablet by mouth every 6 hours if needed 04/04/15  Yes Tammi Sou, MD  ketoconazole (NIZORAL) 2 % shampoo Apply to affected area three times per week as needed 06/19/12  Yes Tammi Sou, MD  Ketotifen Fumarate (ZADITOR OP) Apply 1 drop to eye daily as needed (DRY EYES).    Yes Historical Provider, MD  losartan (COZAAR) 50 MG tablet take 1 tablet by mouth once daily 05/10/15  Yes Tammi Sou, MD  prochlorperazine (COMPAZINE) 10 MG tablet Take 1 tablet (10 mg total) by mouth every 6 (six) hours as needed for nausea or vomiting. 05/31/15  Yes Tammi Sou, MD  ranitidine (ZANTAC) 150 MG tablet Take 150 mg by mouth daily as needed for heartburn.   Yes Historical Provider, MD  simvastatin (ZOCOR) 40 MG tablet take 1 tablet by mouth at bedtime 05/10/15  Yes Tammi Sou, MD  tamsulosin [facility]) 0.4 MG CAPS capsule take 1 capsule by mouth at bedtime 11/10/14  Yes Tammi Sou, MD  temazepam (RESTORIL) 15 MG capsule Take 1 capsule (15 mg total) by mouth at bedtime as needed for sleep. 06/06/15  Yes Tammi Sou, MD  valACYclovir (VALTREX) 1000 MG tablet take 2 tablets by mouth IMMEDIATELY AT ONSET OF  LESION. REPEAT IN 12 HOURS 04/03/13  Yes Tammi Sou, MD    Physical Exam: BP 166/73 mmHg  Pulse 79  Temp(Src) 99.8 F (37.7 C) (Oral)  Resp 11  SpO2 95%  General: Elderly Caucasian gentleman. Awake and alert and oriented x3. No acute cardiopulmonary distress.  Eyes: Pupils equal, round, reactive to light. Extraocular muscles are intact. Sclerae anicteric and noninjected.  ENT:  Moist mucosal membranes. No mucosal lesions.   Neck: Neck supple without lymphadenopathy. No carotid bruits. No masses palpated.  Cardiovascular: Regular rate with normal S1-S2 sounds. No murmurs, rubs, gallops auscultated. No JVD.  Respiratory: Good respiratory effort with no wheezes, rales, rhonchi. Lungs clear to auscultation bilaterally.  Abdomen: Obese. Soft, nontender, nondistended. Active bowel sounds. No masses or hepatosplenomegaly  Skin: Dry, warm to touch. 2+ dorsalis pedis and radial pulses. Musculoskeletal:  No calf or leg pain. All major joints not erythematous nontender.  Psychiatric: Intact judgment and insight.  Neurologic: No focal neurological deficits. Cranial nerves II through XII are grossly intact. The patient does demonstrate word finding difficulties. Lymphatics: No lymphadenopathy in the submandibular, precervical, post cervical, supraclavicular, axillary, inguinal areas.           Labs on Admission:  Basic Metabolic Panel:  Recent Labs Lab 06/16/15 1800  NA 138  K 4.0  CL 104  CO2 26  GLUCOSE 143*  BUN 33*  CREATININE 2.80*  CALCIUM 8.7*   Liver Function Tests:  Recent Labs Lab 06/16/15 1800  AST 18  ALT 15*  ALKPHOS 45  BILITOT 0.6  PROT 6.5  ALBUMIN 3.8   No results for input(s): LIPASE, AMYLASE in the last 168 hours. No results for input(s): AMMONIA in the last 168 hours. CBC:  Recent Labs Lab 06/16/15 1800  WBC 11.5*  HGB 11.6*  HCT 34.4*  MCV 90.8  PLT 218   Cardiac Enzymes: No results for input(s): CKTOTAL, CKMB, CKMBINDEX, TROPONINI in the  last 168 hours.  BNP (last 3 results) No results for input(s): BNP in the last 8760 hours.  ProBNP (last 3 results) No results for input(s): PROBNP in the last 8760 hours.  CBG:  Recent Labs Lab 06/16/15 1745  GLUCAP 141*    Radiological Exams on Admission: Dg Chest 2 View  06/16/2015   CLINICAL DATA:  Cough for 1 day. Altered mental status for 5 days. Hypertension.  EXAM: CHEST  2 VIEW  COMPARISON:  None.  FINDINGS: There is no edema or consolidation. The heart size and pulmonary vascularity are normal. Aorta is prominent and mildly tortuous. No adenopathy. There is degenerative change in the thoracic spine.  IMPRESSION: Prominence of the aorta is likely secondary to chronic hypertensive change. No edema or consolidation.   Electronically Signed   By: Lowella Grip III M.D.   On: 06/16/2015 18:51   Mr Brain Wo Contrast (neuro Protocol)  06/16/2015   CLINICAL DATA:  Initial evaluation for acute worsening aphasia out. Confusion. Left facial droop.  EXAM: MRI HEAD WITHOUT CONTRAST  TECHNIQUE: Multiplanar, multiecho pulse sequences of the brain and surrounding structures were obtained without intravenous contrast.  COMPARISON:  Prior MRI from 06/15/2015.  FINDINGS: No abnormal foci of restricted diffusion to suggest acute intracranial infarct. Normal intravascular flow voids maintained. No acute intracranial hemorrhage. Single tiny chronic micro hemorrhage noted within the right centrum semi ovale. No extra-axial fluid collection.  Again seen is abnormal appearance of the left cerebral hemisphere. Mass light T2/FLAIR hyperintensity involving the left temporal lobe, with involvement of both the white matter and overlying gray matter. Involvement of the left hippocampus. Extension into the left parietal and occipital lobes as well, predominantly involving the white matter. Left insula and subinsular region as well the left thalamus involved as well. Associated partial effacement of the left  lateral ventricle with approximately 6 mm of left-to-right shift. No hydrocephalus. Basilar cisterns remain patent. No infratentorial involvement or extension into the right cerebral hemisphere. Probable mild superimposed chronic small vessel ischemic disease again noted.  Craniocervical junction within normal limits. Incidental note made of a partially empty sella. Pituitary otherwise unremarkable. No acute abnormality about the orbits. Sequelae of prior bilateral lens extraction.  Mild mucosal thickening within the left maxillary sinus. No mastoid effusion. Inner ear structures grossly normal.  IMPRESSION: 1. Stable appearance of mass slight T2 hyperintensity involving the left temporal lobe with additional left  cerebral hemispheric and thalamic involvement as above. 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 Possible encephalitis could also considered. Again, correlate with acuity of clinical presentation and symptomatology. 2. 6 mm of left-to-right midline shift, stable. 3. No new acute abnormality.   Electronically Signed   By: Jeannine Boga M.D.   On: 06/16/2015 22:54   Mr Brain Wo Contrast  06/15/2015   CLINICAL DATA:  Expressive aphasia. Abnormal head CT with left temporal lobe edema. Chronic renal insufficiency.  EXAM: MRI HEAD WITHOUT CONTRAST  TECHNIQUE: Multiplanar, multiecho pulse sequences of the brain and surrounding structures were obtained without intravenous contrast.  COMPARISON:  Head CT 06/14/2015  FINDINGS: A partially empty sella is incidentally noted. There is no evidence of acute infarct or extra-axial fluid collection. There is mild cerebral atrophy which is within normal limits for age. A punctate focus of susceptibility artifact in the high right frontal lobe may reflect a chronic microhemorrhage.  As seen on yesterday's CT, there is an abnormal appearance of the left cerebral hemisphere. There is masslike T2 hyperintensity in the left  temporal lobe, predominantly in the white matter but with cortical involvement as well. There is involvement of the hippocampus. Abnormal T2 signal extends into the left occipital, left parietal, and posterior left frontal lobes, again predominantly involving white matter. There is also involvement of the left insula/ subinsular white matter and left thalamus. There is partial effacement of the left lateral ventricle with approximately 6 mm of rightward midline shift at the level of the thalami, unchanged. The left cerebral hemispheric abnormality does not clearly involve the corpus callosum or extend into the right cerebral hemisphere or brainstem. Small foci of T2 hyperintensity scattered throughout the right cerebral white matter and anterior left frontal white matter are nonspecific but compatible with mild chronic small vessel ischemic disease.  Prior bilateral cataract extraction is noted. There is mild left maxillary sinus mucosal thickening. Major intracranial vascular flow voids are preserved.  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 Encephalitis is an additional consideration. Correlate with acuity of clinical presensation and consider neurosurgical referral and biopsy.  These results will be called to the ordering clinician or representative by the Radiologist Assistant, and communication documented in the PACS or zVision Dashboard.   Electronically Signed   By: Logan Bores   On: 06/15/2015 20:49    EKG: Independently reviewed. Sinus rhythm with sinus arrhythmia with a ventricular rate of 70. Normal intervals. Left ventricular hypertrophy.  Assessment/Plan Present on Admission:  . Encephalopathy . Brain mass . Acute renal failure superimposed on stage 4 chronic kidney disease . Hallucinations  This patient was discussed with the  ED physician, including pertinent vitals, physical exam findings, labs, and imaging.  We also discussed care given by the ED provider.  #1 encephalopathy  This is likely due to brain edema and the mass effect of the brain mass  We'll start the patient on Decadron due to symptoms #2 brain mass  Consult neurosurgery for possible biopsy  Will obtain CT of the abdomen and pelvis to rule out any other masses  Chest x-ray clear #3 acute renal failure superimposed on stage IV chronic kidney disease  We'll hydrate with IV fluids overnight and recheck creatinine in the morning #5 hallucinations  Secondary to edema in the brain  Start Decadron #6 hypertension  Continue home medications  His blood pressure is  elevated in the emergency department, will start the patient on hydralazine 25 mg every 6 hours  DVT prophylaxis: Heparin  Consultants: Consult neurosurgery in the morning  Code Status: DO NOT RESUSCITATE  Family Communication: Wife in the room   Disposition Plan: Admission   Truett Mainland, DO [facility] Pager [phone]

## 2015-06-17 NOTE — Consult Note (Signed)
CC:  Chief Complaint  Patient presents with  . Altered Mental Status    HPI: Philip Shields is a 81 y.o. male (right handed) who presents with hallucinations and dysphasia.  This has been going on for several days apparently.  He reportedly also had a facial droop affecting the right side of his face.  He has not apparently had any other motor weakness or sensory symptoms.  No seizures.  Family is not in the room and he is unable to provide additional history.  PMH: Past Medical History  Diagnosis Date  . Arthritis     Knees and low back  . Anxiety   . GERD (gastroesophageal reflux disease)   . Hyperlipidemia   . Anemia of chronic renal failure   . Chronic renal insufficiency, stage IV (severe) 2014/15    CrCl in the 20s as of 02/2014.  Kidney dz secondary to HTN, chronic NSAIDs (excessive kidney scarring noted)--Dr. Deterding.  Cr 2.2-2.6 range.  Marland Kitchen BPH with elevated PSA   . Hypertensive chronic kidney disease   . Colon polyp 2005    Not sent to path; repeat recommended 5 yrs, so repeat colonoscopy 03/2010 was normal  . Palpitations     Worked up by Dr. Gwenlyn Found (myoview neg, echo normal, 15mo event monitor unremarkable: no new  meds were recommended.  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 Essential hypertension   . Hard of hearing   . Seasonal allergic rhinitis     PSH: Past Surgical History  Procedure Laterality Date  . Transthoracic echocardiogram  11/2010    Concentric LVH, diastolic dysfunction.  EF normal.  Mild mitral regurg.  . Acromioplasty  04/03/02    Left, with repair of complete rotator cuff repair  . Quadriceps tendon repair  2002    Both knees, right most recent  . Lumbar laminectomy  2001    L4  . Knee surgery      Bilateral  . Transthoracic echocardiogram      EF 60-65%, mod LVH, normal valves.  . Carotid dopplers  06/14/15    No stenosis    SH: Social History  Substance Use Topics  . Smoking status: Never Smoker   . Smokeless tobacco:  Never Used  . Alcohol Use: No    MEDS: Prior to Admission medications   Medication Sig Start Date End Date Taking? Authorizing Provider  ALPRAZolam Duanne Moron) 0.5 MG tablet 1 tab qid prn Patient taking differently: Take 0.5 mg by mouth 4 (four) times daily as needed for anxiety or sleep. 1 tab qid prn 06/21/14  Yes Tammi Sou, MD  aspirin 81 MG tablet Take 81 mg by mouth daily.     Yes Historical Provider, MD  calcitRIOL (ROCALTROL) 0.25 MCG capsule Take 1 capsule by mouth Every morning. 09/26/11  Yes Historical Provider, MD  clotrimazole-betamethasone (LOTRISONE) cream Apply 1 application topically 2 (two) times daily. 06/09/14  Yes Tammi Sou, MD  diclofenac sodium (VOLTAREN) 1 % GEL Apply 2 g topically 4 (four) times daily. 11/24/12  Yes Tammi Sou, MD  diphenoxylate-atropine (LOMOTIL) 2.5-0.025 MG per tablet take 1 or 2 tablets by mouth four times a day if needed to PREVENT diarrhea 07/07/11  Yes Hoover Browns., MD  fexofenadine (ALLEGRA) 180 MG tablet Take 180 mg by mouth daily as needed for allergies or rhinitis.   Yes Historical Provider, MD  fluticasone (FLONASE) 50 MCG/ACT nasal spray 2 sprays each nostril qd Patient  taking differently: Place 2 sprays into both nostrils daily.  02/23/13  Yes Tammi Sou, MD  HYDROcodone-acetaminophen [facility]) 10-325 MG per tablet take 1/2 to 1 tablet by mouth every 6 hours if needed 04/04/15  Yes Tammi Sou, MD  ketoconazole (NIZORAL) 2 % shampoo Apply to affected area three times per week as needed 06/19/12  Yes Tammi Sou, MD  Ketotifen Fumarate (ZADITOR OP) Apply 1 drop to eye daily as needed (DRY EYES).    Yes Historical Provider, MD  losartan (COZAAR) 50 MG tablet take 1 tablet by mouth once daily 05/10/15  Yes Tammi Sou, MD  prochlorperazine (COMPAZINE) 10 MG tablet Take 1 tablet (10 mg total) by mouth every 6 (six) hours as needed for nausea or vomiting. 05/31/15  Yes Tammi Sou, MD  ranitidine  (ZANTAC) 150 MG tablet Take 150 mg by mouth daily as needed for heartburn.   Yes Historical Provider, MD  simvastatin (ZOCOR) 40 MG tablet take 1 tablet by mouth at bedtime 05/10/15  Yes Tammi Sou, MD  tamsulosin [facility]) 0.4 MG CAPS capsule take 1 capsule by mouth at bedtime 11/10/14  Yes Tammi Sou, MD  temazepam (RESTORIL) 15 MG capsule Take 1 capsule (15 mg total) by mouth at bedtime as needed for sleep. 06/06/15  Yes Tammi Sou, MD  valACYclovir (VALTREX) 1000 MG tablet take 2 tablets by mouth IMMEDIATELY AT ONSET OF LESION. REPEAT IN 12 HOURS 04/03/13  Yes Tammi Sou, MD    ALLERGY: No Known Allergies  ROS: ROS  Altered mental status, hallucinations, dysphasia.  No chest pain or shortness of breath.  No abdominal pain.  No fevers or chills.  NEUROLOGIC EXAM: Awake, alert, oriented 2/3 naming, struggles with repeating phrases CN 2-12 intact Motor exam: Upper Extremities Deltoid Bicep Tricep Grip  Right 5/5 5/5 5/5 5/5  Left 5/5 5/5 5/5 5/5   Lower Extremity IP Quad PF DF EHL  Right 5/5 5/5 5/5 5/5 5/5  Left 5/5 5/5 5/5 5/5 5/5  No pronator drift Sensation grossly intact to LT  IMAGING: MRI Brain without contrast 8/17 and 8/18: Cerebral edema involving the left temporal lobe and extending into the occipital lobe, basal ganglia, and thalamus.  Apparent mass in the left uncus.    IMPRESSION: - 81 y.o. male with altered mental status due to cerebral edema from mass in the left temporal lobe and thalamus.  He has deficits as above.  He is currently on aspirin and has been receiving heparin.  Due to inability to receive contrast it isn't clear what is the best target for a biopsy.  I think a needle biopsy of the left thalamus would be low yield.  The best option, which is not a great one in someone of his condition, is a left anterior temporal lobectomy for debulking and biopsy.  His wife is out of the hospital right now.  I'll come back later today and talk to her  to discuss how we proceed.  PLAN: - Dexamethasone 4mg  q6 hours, Keppra 750mg  BID. - If family agrees will perform left anterior temporal lobectomy on Wednesday.

## 2015-06-17 NOTE — Telephone Encounter (Signed)
Noted  

## 2015-06-17 NOTE — Progress Notes (Signed)
Pt admitted from the ED with c/o of AMS, denies any pain pt hallucinating about seeing things, pt settled in bed and was reassured, call light within pt's reach, will continue to monitor. Philip Shields, [name]

## 2015-06-17 NOTE — ED Provider Notes (Signed)
CSN: [id number]     Arrival date & time 06/16/15  1721 History      Chief Complaint  Patient presents with  . Altered Mental Status   HPI HPI Comments: Philip Shields is a 81 y.o. male who presents to the Emergency Department complaining of confusion, atypical behavior, hallucinations. Symptoms began about one week ago, have progressed, notably worse over the past day. Particular, the patient has new hallucinatory visions, as well as auditory sensation. Notably, the patient has been evaluated since the onset of symptoms, including CT, MRI, has an identified lesion on his brain. Characterization was not complete as the patient has a dye allergy, prohibiting contrast study. Patient has not started any new medication, denies new pain. Patient is scheduled for outpatient evaluation in several days. With progression over the past days, the patient was brought by his wife for evaluation.   Past Medical History  Diagnosis Date  . Arthritis     Knees and low back  . Anxiety   . GERD (gastroesophageal reflux disease)   . Hyperlipidemia   . Anemia of chronic renal failure   . Chronic renal insufficiency, stage IV (severe) 2014/15    CrCl in the 20s as of 02/2014.  Kidney dz secondary to HTN, chronic NSAIDs (excessive kidney scarring noted)--Dr. Deterding.  Cr 2.2-2.6 range.  Marland Kitchen BPH with elevated PSA   . Hypertensive chronic kidney disease   . Colon polyp 2005    Not sent to path; repeat recommended 5 yrs, so repeat colonoscopy 03/2010 was normal  . Palpitations     Worked up by Dr. Gwenlyn Found (myoview neg, echo normal, 39mo event monitor unremarkable: no new  meds were recommended.  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 Essential hypertension   . Hard of hearing   . Seasonal allergic rhinitis    Past Surgical History  Procedure Laterality Date  . Transthoracic echocardiogram  11/2010    Concentric LVH, diastolic dysfunction.  EF normal.  Mild mitral regurg.  . Acromioplasty   04/03/02    Left, with repair of complete rotator cuff repair  . Quadriceps tendon repair  2002    Both knees, right most recent  . Lumbar laminectomy  2001    L4  . Knee surgery      Bilateral  . Transthoracic echocardiogram    . Carotid dopplers  06/14/15    No stenosis   Family History  Problem Relation Age of Onset  . Hyperlipidemia Other   . Hypertension Other   . Heart disease Mother   . Diabetes Mother   . Cancer Father     liver cancer   Social History  Substance Use Topics  . Smoking status: Never Smoker   . Smokeless tobacco: Never Used  . Alcohol Use: No    Review of Systems  Constitutional:       Per HPI, otherwise negative  HENT:       Per HPI, otherwise negative  Respiratory:       Per HPI, otherwise negative  Cardiovascular:       Per HPI, otherwise negative  Gastrointestinal: Negative for vomiting.  Endocrine:       Negative aside from HPI  Genitourinary:       Neg aside from HPI   Musculoskeletal:       Per HPI, otherwise negative  Skin: Negative.   Neurological: Negative for syncope.  Psychiatric/Behavioral: Positive for hallucinations, confusion, sleep disturbance, dysphoric mood and  decreased concentration. The patient is nervous/anxious.       Allergies  Review of patient's allergies indicates no known allergies.  Home Medications   Prior to Admission medications   Medication Sig Start Date End Date Taking? Authorizing Provider  ALPRAZolam Duanne Moron) 0.5 MG tablet 1 tab qid prn Patient taking differently: Take 0.5 mg by mouth 4 (four) times daily as needed for anxiety or sleep. 1 tab qid prn 06/21/14  Yes Tammi Sou, MD  aspirin 81 MG tablet Take 81 mg by mouth daily.     Yes Historical Provider, MD  calcitRIOL (ROCALTROL) 0.25 MCG capsule Take 1 capsule by mouth Every morning. 09/26/11  Yes Historical Provider, MD  clotrimazole-betamethasone (LOTRISONE) cream Apply 1 application topically 2 (two) times daily. 06/09/14  Yes Tammi Sou, MD  diclofenac sodium (VOLTAREN) 1 % GEL Apply 2 g topically 4 (four) times daily. 11/24/12  Yes Tammi Sou, MD  diphenoxylate-atropine (LOMOTIL) 2.5-0.025 MG per tablet take 1 or 2 tablets by mouth four times a day if needed to PREVENT diarrhea 07/07/11  Yes Hoover Browns., MD  fexofenadine (ALLEGRA) 180 MG tablet Take 180 mg by mouth daily as needed for allergies or rhinitis.   Yes Historical Provider, MD  fluticasone (FLONASE) 50 MCG/ACT nasal spray 2 sprays each nostril qd Patient taking differently: Place 2 sprays into both nostrils daily.  02/23/13  Yes Tammi Sou, MD  HYDROcodone-acetaminophen [facility]) 10-325 MG per tablet take 1/2 to 1 tablet by mouth every 6 hours if needed 04/04/15  Yes Tammi Sou, MD  ketoconazole (NIZORAL) 2 % shampoo Apply to affected area three times per week as needed 06/19/12  Yes Tammi Sou, MD  Ketotifen Fumarate (ZADITOR OP) Apply 1 drop to eye daily as needed (DRY EYES).    Yes Historical Provider, MD  losartan (COZAAR) 50 MG tablet take 1 tablet by mouth once daily 05/10/15  Yes Tammi Sou, MD  prochlorperazine (COMPAZINE) 10 MG tablet Take 1 tablet (10 mg total) by mouth every 6 (six) hours as needed for nausea or vomiting. 05/31/15  Yes Tammi Sou, MD  ranitidine (ZANTAC) 150 MG tablet Take 150 mg by mouth daily as needed for heartburn.   Yes Historical Provider, MD  simvastatin (ZOCOR) 40 MG tablet take 1 tablet by mouth at bedtime 05/10/15  Yes Tammi Sou, MD  tamsulosin [facility]) 0.4 MG CAPS capsule take 1 capsule by mouth at bedtime 11/10/14  Yes Tammi Sou, MD  temazepam (RESTORIL) 15 MG capsule Take 1 capsule (15 mg total) by mouth at bedtime as needed for sleep. 06/06/15  Yes Tammi Sou, MD  valACYclovir (VALTREX) 1000 MG tablet take 2 tablets by mouth IMMEDIATELY AT ONSET OF LESION. REPEAT IN 12 HOURS 04/03/13  Yes Tammi Sou, MD   BP 160/89 mmHg  Pulse 55  Temp(Src) 99.8 F (37.7 C)  (Oral)  Resp 11  SpO2 97%   Physical Exam  Constitutional: He is oriented to person, place, and time. He appears well-developed. No distress.  HENT:  Head: Normocephalic and atraumatic.  Eyes: Conjunctivae and EOM are normal.  Cardiovascular: Normal rate and regular rhythm.   Pulmonary/Chest: Effort normal. No stridor. No respiratory distress.  Abdominal: He exhibits no distension.  Musculoskeletal: He exhibits no edema.  Neurological: He is alert and oriented to person, place, and time.  Skin: Skin is warm and dry.  Psychiatric: His mood appears anxious.  Nursing note and vitals reviewed.  ED Course  Procedures (including critical care time)  Labs Review Labs Reviewed  COMPREHENSIVE METABOLIC PANEL - Abnormal; Notable for the following:    Glucose, Bld 143 (*)    BUN 33 (*)    Creatinine, Ser 2.80 (*)    Calcium 8.7 (*)    ALT 15 (*)    GFR calc non Af Amer 20 (*)    GFR calc Af Amer 23 (*)    All other components within normal limits  CBC - Abnormal; Notable for the following:    WBC 11.5 (*)    RBC 3.79 (*)    Hemoglobin 11.6 (*)    HCT 34.4 (*)    All other components within normal limits  URINALYSIS, ROUTINE W REFLEX MICROSCOPIC (NOT AT [facility]) - Abnormal; Notable for the following:    Glucose, UA 250 (*)    Hgb urine dipstick TRACE (*)    Protein, ur 30 (*)    All other components within normal limits  CBG MONITORING, ED - Abnormal; Notable for the following:    Glucose-Capillary 141 (*)    All other components within normal limits  URINE MICROSCOPIC-ADD ON  I-STAT CG4 LACTIC ACID, ED  I-STAT CG4 LACTIC ACID, ED    Imaging Review Dg Chest 2 View  06/16/2015   CLINICAL DATA:  Cough for 1 day. Altered mental status for 5 days. Hypertension.  EXAM: CHEST  2 VIEW  COMPARISON:  None.  FINDINGS: There is no edema or consolidation. The heart size and pulmonary vascularity are normal. Aorta is prominent and mildly tortuous. No adenopathy. There is degenerative  change in the thoracic spine.  IMPRESSION: Prominence of the aorta is likely secondary to chronic hypertensive change. No edema or consolidation.   Electronically Signed   By: Lowella Grip III M.D.   On: 06/16/2015 18:51   Mr Brain Wo Contrast (neuro Protocol)  06/16/2015   CLINICAL DATA:  Initial evaluation for acute worsening aphasia out. Confusion. Left facial droop.  EXAM: MRI HEAD WITHOUT CONTRAST  TECHNIQUE: Multiplanar, multiecho pulse sequences of the brain and surrounding structures were obtained without intravenous contrast.  COMPARISON:  Prior MRI from 06/15/2015.  FINDINGS: No abnormal foci of restricted diffusion to suggest acute intracranial infarct. Normal intravascular flow voids maintained. No acute intracranial hemorrhage. Single tiny chronic micro hemorrhage noted within the right centrum semi ovale. No extra-axial fluid collection.  Again seen is abnormal appearance of the left cerebral hemisphere. Mass light T2/FLAIR hyperintensity involving the left temporal lobe, with involvement of both the white matter and overlying gray matter. Involvement of the left hippocampus. Extension into the left parietal and occipital lobes as well, predominantly involving the white matter. Left insula and subinsular region as well the left thalamus involved as well. Associated partial effacement of the left lateral ventricle with approximately 6 mm of left-to-right shift. No hydrocephalus. Basilar cisterns remain patent. No infratentorial involvement or extension into the right cerebral hemisphere. Probable mild superimposed chronic small vessel ischemic disease again noted.  Craniocervical junction within normal limits. Incidental note made of a partially empty sella. Pituitary otherwise unremarkable. No acute abnormality about the orbits. Sequelae of prior bilateral lens extraction.  Mild mucosal thickening within the left maxillary sinus. No mastoid effusion. Inner ear structures grossly normal.   IMPRESSION: 1. Stable appearance of mass slight T2 hyperintensity involving the left temporal lobe with additional left cerebral hemispheric and thalamic involvement as above. 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 Possible encephalitis could also considered.  Again, correlate with acuity of clinical presentation and symptomatology. 2. 6 mm of left-to-right midline shift, stable. 3. No new acute abnormality.   Electronically Signed   By: Jeannine Boga M.D.   On: 06/16/2015 22:54   Mr Brain Wo Contrast  06/15/2015   CLINICAL DATA:  Expressive aphasia. Abnormal head CT with left temporal lobe edema. Chronic renal insufficiency.  EXAM: MRI HEAD WITHOUT CONTRAST  TECHNIQUE: Multiplanar, multiecho pulse sequences of the brain and surrounding structures were obtained without intravenous contrast.  COMPARISON:  Head CT 06/14/2015  FINDINGS: A partially empty sella is incidentally noted. There is no evidence of acute infarct or extra-axial fluid collection. There is mild cerebral atrophy which is within normal limits for age. A punctate focus of susceptibility artifact in the high right frontal lobe may reflect a chronic microhemorrhage.  As seen on yesterday's CT, there is an abnormal appearance of the left cerebral hemisphere. There is masslike T2 hyperintensity in the left temporal lobe, predominantly in the white matter but with cortical involvement as well. There is involvement of the hippocampus. Abnormal T2 signal extends into the left occipital, left parietal, and posterior left frontal lobes, again predominantly involving white matter. There is also involvement of the left insula/ subinsular white matter and left thalamus. There is partial effacement of the left lateral ventricle with approximately 6 mm of rightward midline shift at the level of the thalami, unchanged. The left cerebral hemispheric abnormality does not clearly involve the corpus callosum or extend  into the right cerebral hemisphere or brainstem. Small foci of T2 hyperintensity scattered throughout the right cerebral white matter and anterior left frontal white matter are nonspecific but compatible with mild chronic small vessel ischemic disease.  Prior bilateral cataract extraction is noted. There is mild left maxillary sinus mucosal thickening. Major intracranial vascular flow voids are preserved.  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 Encephalitis is an additional consideration. Correlate with acuity of clinical presensation and consider neurosurgical referral and biopsy.  These results will be called to the ordering clinician or representative by the Radiologist Assistant, and communication documented in the PACS or zVision Dashboard.   Electronically Signed   By: Logan Bores   On: 06/15/2015 20:49   I have personally reviewed and evaluated these images and lab results as part of my medical decision-making.   After the initial evaluation I discussed patient's case with our neurologist. We discussed his recent imaging studies, his and family concern for progression of disease. Recommend initially was for additional MRI to evaluate for possible edematous changes around his lesion.   After MRI results were available, I discussed all findings with patient and his wife. He describes ongoing hallucinatory thoughts. He denies any ongoing neck pain, fever.   MDM  Patient presents with concern of worsening hallucinatory behavior, atypical actions. Patient has recently identified brain lesion, likely mass, though possibly other pathology. Patient is no neck tenderness, stiffness, is only minimally febrile, there is low suspicion for meningitis. Patient's has small midline shift, and lumbar puncture is not feasible. Given the  patient's progression of symptoms, MRI was performed again, did not demonstrate notable changes. Given the persistency, worsening of his symptoms, patient was admitted for further evaluation and management.   Carmin Muskrat, MD 06/17/15 0111

## 2015-06-17 NOTE — Evaluation (Signed)
Physical Therapy Evaluation Patient Details Name: Philip Shields MRN: [id number] DOB: 07-28-1933 Today's Date: 06/17/2015   History of Present Illness  Pt adm with aphasia and hallucinations. MRI revealed lt brain mass. Pt started on steroids and for possible surgery next week. PMH - back surgery, bil quad tendon repair.  Clinical Impression  Pt admitted with above diagnosis and presents to PT with functional limitations due to deficits listed below (See PT problem list). Pt needs skilled PT to maximize independence and safety to allow discharge. Currently pt requires vigilant 24 hour supervision/assist due to his decr balance and his decr awareness of his deficits which make him very high fall risk. Unsure if family is able to provide this assistance.      Follow Up Recommendations Supervision/Assistance - 24 hour (to be further assessed after determination about surgery.)    Equipment Recommendations  Rolling walker with 5" wheels (unsure if pt has this already)    Recommendations for Other Services OT consult     Precautions / Restrictions Precautions Precautions: Fall      Mobility  Bed Mobility Overal bed mobility: Needs Assistance Bed Mobility: Supine to Sit;Sit to Supine     Supine to sit: Supervision Sit to supine: Supervision   General bed mobility comments: for safety  Transfers Overall transfer level: Needs assistance Equipment used: None Transfers: Sit to/from Stand Sit to Stand: Min assist         General transfer comment: Assist for balance  Ambulation/Gait Ambulation/Gait assistance: Min assist Ambulation Distance (Feet): 150 Feet Assistive device: Rolling walker (2 wheeled);1 person hand held assist Gait Pattern/deviations: Step-to pattern;Staggering left;Staggering right Gait velocity: Trying to move too fast.   General Gait Details: Assist for balance. Pt unaware of deficits and trying to move too quickly.  Stairs            Wheelchair  Mobility    Modified Rankin (Stroke Patients Only)       Balance Overall balance assessment: Needs assistance Sitting-balance support: No upper extremity supported;Feet supported Sitting balance-Leahy Scale: Good     Standing balance support: No upper extremity supported;During functional activity Standing balance-Leahy Scale: Fair                               Pertinent Vitals/Pain Pain Assessment: Faces Faces Pain Scale: No hurt    Home Living Family/patient expects to be discharged to:: Private residence Living Arrangements: Spouse/significant other Available Help at Discharge: Family             Additional Comments: Unable to determine from pt.    Prior Function Level of Independence: Independent         Comments: appears he was independent but haven't verified with family.     Hand Dominance        Extremity/Trunk Assessment   Upper Extremity Assessment: Defer to OT evaluation           Lower Extremity Assessment: Overall WFL for tasks assessed         Communication   Communication: Expressive difficulties  Cognition Arousal/Alertness: Awake/alert Behavior During Therapy: Impulsive Overall Cognitive Status: Difficult to assess Area of Impairment: Orientation;Attention;Memory;Following commands;Safety/judgement;Awareness Orientation Level: Disoriented to;Place;Time;Situation Current Attention Level: Sustained   Following Commands: Follows one step commands consistently Safety/Judgement: Decreased awareness of safety;Decreased awareness of deficits Awareness:  (pre-intellectual)        General Comments      Exercises  Assessment/Plan    PT Assessment Patient needs continued PT services  PT Diagnosis Difficulty walking;Abnormality of gait;Altered mental status   PT Problem List Decreased strength;Decreased activity tolerance;Decreased balance;Decreased mobility;Decreased cognition;Decreased knowledge of use  of DME;Decreased safety awareness;Decreased knowledge of precautions  PT Treatment Interventions DME instruction;Gait training;Functional mobility training;Therapeutic activities;Therapeutic exercise;Balance training;Cognitive remediation;Patient/family education   PT Goals (Current goals can be found in the Care Plan section) Acute Rehab PT Goals Patient Stated Goal: unable to state PT Goal Formulation: Patient unable to participate in goal setting Time For Goal Achievement: 07/01/15 Potential to Achieve Goals: Fair    Frequency Min 4X/week   Barriers to discharge        Co-evaluation               End of Session Equipment Utilized During Treatment: Gait belt Activity Tolerance: Patient tolerated treatment well Patient left: in bed;with call bell/phone within reach;with bed alarm set Nurse Communication: Mobility status         Time: 8177-1165 PT Time Calculation (min) (ACUTE ONLY): 20 min   Charges:   PT Evaluation $Initial PT Evaluation Tier I: 1 Procedure     PT G Codes:        Larisa Lanius Jul 09, 2015, 4:38 PM  [facility] PT [phone]

## 2015-06-18 DIAGNOSIS — G939 Disorder of brain, unspecified: Secondary | ICD-10-CM

## 2015-06-18 DIAGNOSIS — G9340 Encephalopathy, unspecified: Principal | ICD-10-CM

## 2015-06-18 DIAGNOSIS — R443 Hallucinations, unspecified: Secondary | ICD-10-CM

## 2015-06-18 MED ORDER — LEVETIRACETAM 750 MG PO TABS: 750.0000 mg | ORAL_TABLET | Freq: Two times a day (BID) | ORAL | Status: DC

## 2015-06-18 MED ORDER — DEXAMETHASONE 4 MG PO TABS: 4.0000 mg | ORAL_TABLET | Freq: Four times a day (QID) | ORAL | Status: DC

## 2015-06-18 MED ORDER — HYDRALAZINE HCL 25 MG PO TABS: 25.0000 mg | ORAL_TABLET | Freq: Four times a day (QID) | ORAL | Status: DC

## 2015-06-18 NOTE — Discharge Summary (Signed)
Physician Discharge Summary  Philip Shields YKD:[id number] DOB: [birth year] DOA: 06/16/2015  PCP: Tammi Sou, MD  Admit date: 06/16/2015 Discharge date: 06/18/2015  Time spent: 35 minutes  Recommendations for Outpatient Follow-up:  1. Neurology appointment Monday 2. Neurosurgery appointment Tuesday  Discharge Diagnoses:  Active Problems:   Encephalopathy   Brain mass   Acute renal failure superimposed on stage 4 chronic kidney disease   Hallucinations   Discharge Condition: stable  Diet recommendation: cardiac  Filed Weights   06/17/15 0135  Weight: 95.851 kg (211 lb 5 oz)    History of present illness:  Philip Shields is a 81 y.o. male  With a history of GERD, stage IV chronic renal insufficiency, hypertension, arthritis, hyperlipidemia. Patient seen for 4 days of progressive aphasia, auditory and visual hallucinations, with a CT and MRI obtained as an outpatient that is concerning for a brain mass. The patient was brought to the emergency department as his symptoms have been progressing and not improving. There've been no palliating or provoking factors. In discussing with the patient, he states symptoms have started approximately 3 weeks ago but have gradually increased. His wife is only noticed these symptoms since Sunday. Patient saw his kidney doctor earlier this week, who ordered carotid ultrasound, CT of the head. His carotid ultrasound was normal, but the head CT showed abnormal edema involving left temporal lobe and extending into the left occipital and parietal lobes. 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 Due to the auditory and visual hallucinations, both the patient and his wife are comfortable with him staying at home.  Hospital Course:  81 y.o. male with altered mental status due to cerebral edema from mass in the left temporal lobe and thalamus. - Dexamethasone 4mg  q6 hours, Keppra 750mg   BID Appointment with Dr. Cyndy Freeze on 8/23 to discuss surgery further   Procedures:    Consultations:  neurosurgery  Discharge Exam: Filed Vitals:   06/18/15 0855  BP: 142/64  Pulse: 81  Temp: 99.3 F (37.4 C)  Resp: 20     Discharge Instructions   Discharge Instructions    Diet - low sodium heart healthy    Complete by:  As directed      Discharge instructions    Complete by:  As directed   24 hour supervision No driving Keep appointments on Monday and Tuesday     Increase activity slowly    Complete by:  As directed           Current Discharge Medication List    START taking these medications   Details  dexamethasone (DECADRON) 4 MG tablet Take 1 tablet (4 mg total) by mouth 4 (four) times daily. Qty: 80 tablet, Refills: 0    hydrALAZINE (APRESOLINE) 25 MG tablet Take 1 tablet (25 mg total) by mouth every 6 (six) hours. Qty: 120 tablet, Refills: 0    levETIRAcetam (KEPPRA) 750 MG tablet Take 1 tablet (750 mg total) by mouth 2 (two) times daily. Qty: 60 tablet, Refills: 0      CONTINUE these medications which have NOT CHANGED   Details  calcitRIOL (ROCALTROL) 0.25 MCG capsule Take 1 capsule by mouth Every morning.    clotrimazole-betamethasone (LOTRISONE) cream Apply 1 application topically 2 (two) times daily. Qty: 45 g, Refills: 1    diclofenac sodium (VOLTAREN) 1 % GEL Apply 2 g topically 4 (four) times daily. Qty: 100 g, Refills: 5    diphenoxylate-atropine (LOMOTIL) 2.5-0.025 MG per  tablet take 1 or 2 tablets by mouth four times a day if needed to PREVENT diarrhea Qty: 100 tablet, Refills: 5    fexofenadine (ALLEGRA) 180 MG tablet Take 180 mg by mouth daily as needed for allergies or rhinitis.    fluticasone (FLONASE) 50 MCG/ACT nasal spray 2 sprays each nostril qd Qty: 48 g, Refills: 0   Associated Diagnoses: Hyperlipidemia    HYDROcodone-acetaminophen (NORCO) 10-325 MG per tablet take 1/2 to 1 tablet by mouth every 6 hours if needed Qty:  100 tablet, Refills: 0    ketoconazole (NIZORAL) 2 % shampoo Apply to affected area three times per week as needed Qty: 240 mL, Refills: 6   Associated Diagnoses: Hyperlipidemia    Ketotifen Fumarate (ZADITOR OP) Apply 1 drop to eye daily as needed (DRY EYES).     losartan (COZAAR) 50 MG tablet take 1 tablet by mouth once daily Qty: 90 tablet, Refills: 1    prochlorperazine (COMPAZINE) 10 MG tablet Take 1 tablet (10 mg total) by mouth every 6 (six) hours as needed for nausea or vomiting. Qty: 30 tablet, Refills: 5    ranitidine (ZANTAC) 150 MG tablet Take 150 mg by mouth daily as needed for heartburn.    simvastatin (ZOCOR) 40 MG tablet take 1 tablet by mouth at bedtime Qty: 90 tablet, Refills: 1    tamsulosin (FLOMAX) 0.4 MG CAPS capsule take 1 capsule by mouth at bedtime Qty: 90 capsule, Refills: 1    valACYclovir (VALTREX) 1000 MG tablet take 2 tablets by mouth IMMEDIATELY AT ONSET OF LESION. REPEAT IN 12 HOURS Qty: 30 tablet, Refills: 1      STOP taking these medications     ALPRAZolam (XANAX) 0.5 MG tablet      aspirin 81 MG tablet      temazepam (RESTORIL) 15 MG capsule        No Known Allergies Follow-up Information    Follow up with MCGOWEN,PHILIP H, MD In 1 week.   Specialty:  Family Medicine   Contact information:   [address] [place] [address] [phone]        The results of significant diagnostics from this hospitalization (including imaging, microbiology, ancillary and laboratory) are listed below for reference.    Significant Diagnostic Studies: Ct Abdomen Pelvis Wo Contrast  06/17/2015   CLINICAL DATA:  81 year old male with left temporal lobe masslike signal abnormality on recent brain imaging. Acute renal failure.  EXAM: CT ABDOMEN AND PELVIS WITHOUT CONTRAST  TECHNIQUE: Multidetector CT imaging of the abdomen and pelvis was performed following the standard protocol without IV contrast.  COMPARISON:  None.  FINDINGS: Lower chest:  Subsegmental scarring versus atelectasis in the lower lobe bases.  Hepatobiliary: There is an indeterminate 2.4 x 2.0 cm hypodense segment 8 right liver lobe mass (series 2/image 14). Otherwise normal liver, with no additional liver masses. Normal gallbladder, with no radiopaque cholelithiasis. No biliary ductal dilatation.  Pancreas: There are clustered tiny coarse calcifications in the pancreatic head, which are nonspecific and could represent vascular calcifications or less likely evidence of chronic pancreatitis. Mild diffuse fatty infiltration of the otherwise normal pancreas.  Spleen: Normal.  Adrenals/Urinary Tract: Normal adrenals. Simple 1.1 cm renal cyst in the lower lateral right kidney. Simple 1.2 cm renal cyst in the anterior upper right kidney. Simple 3.4 cm renal cyst in the anterior lower left kidney. Subcentimeter hyperdense renal cortical lesions in the lower left kidney are too small to characterize, and are likely to represent benign  hemorrhagic renal cysts. No nephrolithiasis. No hydronephrosis. Normal caliber ureters, with no ureteral stones. Normal urinary bladder.  Stomach/Bowel: Collapsed and grossly normal stomach. Normal caliber wall small bowel, with no small bowel wall thickening. Normal appendix. Normal large bowel, with no diverticulosis and no large bowel wall thickening.  Vascular/Lymphatic: Atherosclerotic nonaneurysmal abdominal aorta. No abdominopelvic lymphadenopathy.  Reproductive: Mild prostatomegaly.  Other: No pneumoperitoneum, ascites or focal fluid collection.  Musculoskeletal: Small fat containing right inguinal hernia. Small fat containing umbilical hernia. Marked degenerative changes in the visualized thoracolumbar spine. There are bilateral pars interarticularis defects at L4 with 11 mm anterolisthesis of L4 on L5. No aggressive appearing focal osseous lesions.  IMPRESSION: 1. Indeterminate 2.4 cm hypodense segment 8 right liver lobe mass, metastasis not excluded. A  liver mass protocol MRI or CT of the abdomen with and without intravenous contrast is necessary to characterize this lesion by imaging. 2. Bilateral benign-appearing renal cysts. 3. Mild prostatomegaly. 4. Small fat containing right inguinal and umbilical hernias.   Electronically Signed   By: Ilona Sorrel M.D.   On: 06/17/2015 09:27   Dg Chest 2 View  06/16/2015   CLINICAL DATA:  Cough for 1 day. Altered mental status for 5 days. Hypertension.  EXAM: CHEST  2 VIEW  COMPARISON:  None.  FINDINGS: There is no edema or consolidation. The heart size and pulmonary vascularity are normal. Aorta is prominent and mildly tortuous. No adenopathy. There is degenerative change in the thoracic spine.  IMPRESSION: Prominence of the aorta is likely secondary to chronic hypertensive change. No edema or consolidation.   Electronically Signed   By: Lowella Grip III M.D.   On: 06/16/2015 18:51   Ct Head Wo Contrast  06/15/2015   CLINICAL DATA:  Aphasia and disorientation.  EXAM: CT HEAD WITHOUT CONTRAST  TECHNIQUE: Contiguous axial images were obtained from the base of the skull through the vertex without intravenous contrast.  COMPARISON:  None.  FINDINGS: There is a large region of abnormal edema involving the entire left temporal lobe extending into the subinsular region. Some edema also extends into the left occipital and parietal lobes. There is some mass effect on the left temporal horn of the lateral ventricle and the mid brain. Increased density at the tip of the temporal lobe may represent focal parenchymal hemorrhage versus density due to an underlying mass. No hydrocephalus identified. No extra-axial fluid collections. The skull is unremarkable.  IMPRESSION: Large region of abnormal edema involving the left temporal lobe and likely extending into the left occipital and parietal lobes. There does appear to be some associated mass-effect an increased density at the tip of the temporal lobe. Findings are worrisome  for under underlying mass lesion versus atypical infarct. Depending on the status of renal function, a contrast-enhanced CT or MRI would be helpful for further evaluation. If the patient cannot receive gadolinium, nonenhanced MRI would also be helpful.  These results will be called to the ordering clinician or representative by the Radiologist Assistant, and communication documented in the PACS or zVision Dashboard.   Electronically Signed   By: Aletta Edouard M.D.   On: 06/15/2015 08:30   Mr Brain Wo Contrast (neuro Protocol)  06/16/2015   CLINICAL DATA:  Initial evaluation for acute worsening aphasia out. Confusion. Left facial droop.  EXAM: MRI HEAD WITHOUT CONTRAST  TECHNIQUE: Multiplanar, multiecho pulse sequences of the brain and surrounding structures were obtained without intravenous contrast.  COMPARISON:  Prior MRI from 06/15/2015.  FINDINGS: No abnormal foci of restricted  diffusion to suggest acute intracranial infarct. Normal intravascular flow voids maintained. No acute intracranial hemorrhage. Single tiny chronic micro hemorrhage noted within the right centrum semi ovale. No extra-axial fluid collection.  Again seen is abnormal appearance of the left cerebral hemisphere. Mass light T2/FLAIR hyperintensity involving the left temporal lobe, with involvement of both the white matter and overlying gray matter. Involvement of the left hippocampus. Extension into the left parietal and occipital lobes as well, predominantly involving the white matter. Left insula and subinsular region as well the left thalamus involved as well. Associated partial effacement of the left lateral ventricle with approximately 6 mm of left-to-right shift. No hydrocephalus. Basilar cisterns remain patent. No infratentorial involvement or extension into the right cerebral hemisphere. Probable mild superimposed chronic small vessel ischemic disease again noted.  Craniocervical junction within normal limits. Incidental note made  of a partially empty sella. Pituitary otherwise unremarkable. No acute abnormality about the orbits. Sequelae of prior bilateral lens extraction.  Mild mucosal thickening within the left maxillary sinus. No mastoid effusion. Inner ear structures grossly normal.  IMPRESSION: 1. Stable appearance of mass slight T2 hyperintensity involving the left temporal lobe with additional left cerebral hemispheric and thalamic involvement as above. 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 Possible encephalitis could also considered. Again, correlate with acuity of clinical presentation and symptomatology. 2. 6 mm of left-to-right midline shift, stable. 3. No new acute abnormality.   Electronically Signed   By: Jeannine Boga M.D.   On: 06/16/2015 22:54   Mr Brain Wo Contrast  06/15/2015   CLINICAL DATA:  Expressive aphasia. Abnormal head CT with left temporal lobe edema. Chronic renal insufficiency.  EXAM: MRI HEAD WITHOUT CONTRAST  TECHNIQUE: Multiplanar, multiecho pulse sequences of the brain and surrounding structures were obtained without intravenous contrast.  COMPARISON:  Head CT 06/14/2015  FINDINGS: A partially empty sella is incidentally noted. There is no evidence of acute infarct or extra-axial fluid collection. There is mild cerebral atrophy which is within normal limits for age. A punctate focus of susceptibility artifact in the high right frontal lobe may reflect a chronic microhemorrhage.  As seen on yesterday's CT, there is an abnormal appearance of the left cerebral hemisphere. There is masslike T2 hyperintensity in the left temporal lobe, predominantly in the white matter but with cortical involvement as well. There is involvement of the hippocampus. Abnormal T2 signal extends into the left occipital, left parietal, and posterior left frontal lobes, again predominantly involving white matter. There is also involvement of the left insula/ subinsular white matter  and left thalamus. There is partial effacement of the left lateral ventricle with approximately 6 mm of rightward midline shift at the level of the thalami, unchanged. The left cerebral hemispheric abnormality does not clearly involve the corpus callosum or extend into the right cerebral hemisphere or brainstem. Small foci of T2 hyperintensity scattered throughout the right cerebral white matter and anterior left frontal white matter are nonspecific but compatible with mild chronic small vessel ischemic disease.  Prior bilateral cataract extraction is noted. There is mild left maxillary sinus mucosal thickening. Major intracranial vascular flow voids are preserved.  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 Encephalitis is an additional consideration. Correlate with acuity of clinical presensation and consider neurosurgical referral and biopsy.  These results will be called to the ordering clinician or representative by the Radiologist Assistant, and communication  documented in the PACS or zVision Dashboard.   Electronically Signed   By: Logan Bores   On: 06/15/2015 20:49   US Carotid Bilateral  06/14/2015   CLINICAL DATA:  81 year old male with expressive aphasia  EXAM: BILATERAL CAROTID DUPLEX ULTRASOUND  TECHNIQUE: Pearline Cables scale imaging, color Doppler and duplex ultrasound were performed of bilateral carotid and vertebral arteries in the neck.  COMPARISON:  None.  FINDINGS: Criteria: Quantification of carotid stenosis is based on velocity parameters that correlate the residual internal carotid diameter with NASCET-based stenosis levels, using the diameter of the distal internal carotid lumen as the denominator for stenosis measurement.  The following velocity measurements were obtained:  RIGHT  ICA:  82/12 cm/sec  CCA:  062/37 cm/sec  SYSTOLIC ICA/CCA  RATIO:  0.8  DIASTOLIC ICA/CCA RATIO:  0.9  ECA:  130 cm/sec  LEFT  ICA:  89/25 cm/sec  CCA:  62/8 cm/sec  SYSTOLIC ICA/CCA RATIO:  1.4  DIASTOLIC ICA/CCA RATIO:  2.7  ECA:  113 cm/sec  RIGHT CAROTID ARTERY: Tortuous proximal common carotid anatomy. No significant atherosclerotic plaque or evidence of stenosis.  RIGHT VERTEBRAL ARTERY:  Patent with normal antegrade flow.  LEFT CAROTID ARTERY: No significant atherosclerotic plaque or evidence of stenosis.  LEFT VERTEBRAL ARTERY:  Patent with normal antegrade flow.  IMPRESSION: 1. No evidence of atherosclerotic plaque or significant stenosis in either carotid artery. 2. Vertebral arteries are patent with normal antegrade flow.  Signed,  Criselda Peaches, MD  [facility]  [facility]   Electronically Signed   By: Jacqulynn Cadet M.D.   On: 06/14/2015 16:59    Microbiology: No results found for this or any previous visit (from the past 240 hour(s)).   Labs: Basic Metabolic Panel:  Recent Labs Lab 06/16/15 1800 06/17/15 0510  NA 138 137  K 4.0 4.0  CL 104 105  CO2 26 25  GLUCOSE 143* 137*  BUN 33* 30*  CREATININE 2.80* 2.48*  CALCIUM 8.7* 8.2*   Liver Function Tests:  Recent Labs Lab 06/16/15 1800  AST 18  ALT 15*  ALKPHOS 45  BILITOT 0.6  PROT 6.5  ALBUMIN 3.8   No results for input(s): LIPASE, AMYLASE in the last 168 hours. No results for input(s): AMMONIA in the last 168 hours. CBC:  Recent Labs Lab 06/16/15 1800 06/17/15 0510  WBC 11.5* 11.7*  HGB 11.6* 11.1*  HCT 34.4* 32.7*  MCV 90.8 89.6  PLT 218 199   Cardiac Enzymes: No results for input(s): CKTOTAL, CKMB, CKMBINDEX, TROPONINI in the last 168 hours. BNP: BNP (last 3 results) No results for input(s): BNP in the last 8760 hours.  ProBNP (last 3 results) No results for input(s): PROBNP in the last 8760 hours.  CBG:  Recent Labs Lab 06/16/15 1745  GLUCAP 141*       Signed:  Meeghan Skipper  [facility] 06/18/2015, 10:23 AM

## 2015-06-18 NOTE — Consult Note (Signed)
Spoke with patient, patient's wife and son on the evening of 06/17/15.  Only option is left ATL. This would be purely diagnostic and stands a reasonable chance of making him neurologically worse and no chance of improving his condition.  They are going to consider whether or not this is worth it to them.  They are going to see me in clinic Tuesday morning to continue this discussion.  I will sign off for the remainder of his inpatient stay.  Feel free to call with questions.

## 2015-06-20 ENCOUNTER — Ambulatory Visit: Payer: Medicare Other | Admitting: Family Medicine

## 2015-06-20 ENCOUNTER — Ambulatory Visit: Payer: Medicare Other | Admitting: Neurology

## 2015-06-20 NOTE — Telephone Encounter (Signed)
Noted  

## 2015-06-22 ENCOUNTER — Ambulatory Visit (INDEPENDENT_AMBULATORY_CARE_PROVIDER_SITE_OTHER): Payer: Medicare Other | Admitting: Neurology

## 2015-06-22 ENCOUNTER — Encounter: Payer: Self-pay | Admitting: Neurology

## 2015-06-22 VITALS — BP 120/80 | HR 87 | Resp 16 | Wt 213.0 lb

## 2015-06-22 DIAGNOSIS — R220 Localized swelling, mass and lump, head: Secondary | ICD-10-CM | POA: Diagnosis not present

## 2015-06-22 DIAGNOSIS — G40109 Localization-related (focal) (partial) symptomatic epilepsy and epileptic syndromes with simple partial seizures, not intractable, without status epilepticus: Secondary | ICD-10-CM | POA: Diagnosis not present

## 2015-06-22 DIAGNOSIS — G9389 Other specified disorders of brain: Secondary | ICD-10-CM

## 2015-06-22 MED ORDER — LEVETIRACETAM 750 MG PO TABS: 750.0000 mg | ORAL_TABLET | Freq: Two times a day (BID) | ORAL | Status: DC

## 2015-06-22 MED ORDER — LAMOTRIGINE ER 25 MG PO TB24: ORAL_TABLET | ORAL | Status: AC

## 2015-06-22 NOTE — Progress Notes (Signed)
NEUROLOGY CONSULTATION NOTE  [name] MRN: [id number] DOB: [birth year]  Referring provider: Dr. Shawnie Dapper Primary care provider: Dr. Shawnie Dapper  Reason for consult:  Abnormal MRI, aphasia  Dear Dr Anitra Lauth:  Thank you for your kind referral of Philip Shields for consultation of the above symptoms. Although his history is well known to you, please allow me to reiterate it for the purpose of our medical record. The patient was accompanied to the clinic by his wife who also provides collateral information. Records and images were personally reviewed where available.  HISTORY OF PRESENT ILLNESS: This is a pleasant 81 year old right-handed man with a history of stage IV chronic renal insufficiency, hypertension, hyperlipidemia, in his usual state of health until around 3 weeks ago when he started having recurrent feelings that he describes as "ghost-type." He can feel them as "just different" but has difficulty describing the symptoms. There is a little taste and sweet smell lasting 35-40 seconds, with occasional numbness in his left hand. These were occurring 2-3 times a day. He started noticing word-finding difficulties, that became more noticeable to his wife 1-1/2 weeks ago while they were in [place], he could not find his words and would have nonsensical speech. He refused to go to the hospital. They saw his nephrologist who ordered a head CT which showed edema in the left temporal lobe, extending to the left occipital and parietal lobes. He had an MRI brain without contrast (unable to do with contrast due to renal insufficiency), which I personally reviewed, showing increaseed T2 hyperintensity in the left temporal lobe, predominantly in the white matter but with cortical involvement as well. 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 Encephalitis is also a consideration. They decided to initially stay at home, until 8/19 when she started having hallucinations. His wife reports that he had a bad dream where he reported people were pulling him over the 41 mark, and that he would not live past 81 years old. The next day he slept most of the day, then started becoming shaky and kept asking his wife if she could see, smell, or hear them. He was started on Decadron and Keppra 750mg  BID. He and his wife report that the expressive aphasia is much better since starting the Keppra, she states he is still searching for words but much improved. She denies any confusion. They deny any focal weakness, staring/unresponsive episodes. He denies any rising epigastric sensation, focal numbness/tingling, myoclonic jerks. No convulsions.   He denies any headaches, dizziness, diplopia, dysarthria, dysphagia, neck/back pain, bowel/bladder dysfunction. He had a normal birth and early development.  There is no history of febrile convulsions, CNS infections such as meningitis/encephalitis, significant traumatic brain injury, neurosurgical procedures, or family history of seizures.  PAST MEDICAL HISTORY: Past Medical History  Diagnosis Date  . Arthritis     Knees and low back  . Anxiety   . GERD (gastroesophageal reflux disease)   . Hyperlipidemia   . Anemia of chronic renal failure   . Chronic renal insufficiency, stage IV (severe) 2014/15    CrCl in the 20s as of 02/2014.  Kidney dz secondary to HTN, chronic NSAIDs (excessive kidney scarring noted)--Dr. Deterding.  Cr 2.2-2.6 range.  Marland Kitchen BPH with elevated PSA   . Hypertensive chronic kidney disease   . Colon polyp 2005  Not sent to path; repeat recommended 5 yrs, so repeat colonoscopy 03/2010 was normal  . Palpitations     Worked up by Dr. Gwenlyn Found (myoview neg, echo normal, 28mo event monitor unremarkable: no new  meds were recommended.  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 Essential hypertension   . Hard of hearing   . Seasonal allergic rhinitis     PAST SURGICAL HISTORY: Past Surgical History  Procedure Laterality Date  . Transthoracic echocardiogram  11/2010    Concentric LVH, diastolic dysfunction.  EF normal.  Mild mitral regurg.  . Acromioplasty  04/03/02    Left, with repair of complete rotator cuff repair  . Quadriceps tendon repair  2002    Both knees, right most recent  . Lumbar laminectomy  2001    L4  . Knee surgery      Bilateral  . Transthoracic echocardiogram      EF 60-65%, mod LVH, normal valves.  . Carotid dopplers  06/14/15    No stenosis  . Rotator cuff repair      Right    MEDICATIONS: Current Outpatient Prescriptions on File Prior to Visit  Medication Sig Dispense Refill  . calcitRIOL (ROCALTROL) 0.25 MCG capsule Take 1 capsule by mouth Every morning.    . clotrimazole-betamethasone (LOTRISONE) cream Apply 1 application topically 2 (two) times daily. 45 g 1  . dexamethasone (DECADRON) 4 MG tablet Take 1 tablet (4 mg total) by mouth 4 (four) times daily. 80 tablet 0  . diclofenac sodium (VOLTAREN) 1 % GEL Apply 2 g topically 4 (four) times daily. 100 g 5  . diphenoxylate-atropine (LOMOTIL) 2.5-0.025 MG per tablet take 1 or 2 tablets by mouth four times a day if needed to PREVENT diarrhea 100 tablet 5  . fexofenadine (ALLEGRA) 180 MG tablet Take 180 mg by mouth daily as needed for allergies or rhinitis.    . fluticasone (FLONASE) 50 MCG/ACT nasal spray 2 sprays each nostril qd (Patient taking differently: Place 2 sprays into both nostrils daily. ) 48 g 0  . hydrALAZINE (APRESOLINE) 25 MG tablet Take 1 tablet (25 mg total) by mouth every 6 (six) hours. 120 tablet 0  . HYDROcodone-acetaminophen (NORCO) 10-325 MG per tablet take 1/2 to 1 tablet by mouth every 6 hours if needed 100 tablet 0  . ketoconazole (NIZORAL) 2 % shampoo Apply to affected area three times per week as needed 240 mL 6  . Ketotifen  Fumarate (ZADITOR OP) Apply 1 drop to eye daily as needed (DRY EYES).     Marland Kitchen levETIRAcetam (KEPPRA) 750 MG tablet Take 1 tablet (750 mg total) by mouth 2 (two) times daily. 60 tablet 0  . losartan (COZAAR) 50 MG tablet take 1 tablet by mouth once daily 90 tablet 1  . prochlorperazine (COMPAZINE) 10 MG tablet Take 1 tablet (10 mg total) by mouth every 6 (six) hours as needed for nausea or vomiting. 30 tablet 5  . ranitidine (ZANTAC) 150 MG tablet Take 150 mg by mouth daily as needed for heartburn.    . simvastatin (ZOCOR) 40 MG tablet take 1 tablet by mouth at bedtime 90 tablet 1  . tamsulosin (FLOMAX) 0.4 MG CAPS capsule take 1 capsule by mouth at bedtime 90 capsule 1  . valACYclovir (VALTREX) 1000 MG tablet take 2 tablets by mouth IMMEDIATELY AT ONSET OF LESION. REPEAT IN 12 HOURS 30 tablet 1   No current facility-administered medications on file prior to visit.  ALLERGIES: No Known Allergies  FAMILY HISTORY: Family History  Problem Relation Age of Onset  . Hyperlipidemia Other   . Hypertension Other   . Heart disease Mother   . Diabetes Mother   . Cancer Father     liver cancer    SOCIAL HISTORY: Social History   Social History  . Marital Status: Married    Spouse Name: N/A  . Number of Children: 2  . Years of Education: N/A   Occupational History  . Retired    Social History Main Topics  . Smoking status: Never Smoker   . Smokeless tobacco: Never Used  . Alcohol Use: No  . Drug Use: No  . Sexual Activity: Not on file   Other Topics Concern  . Not on file   Social History Narrative   Married.   Two sons.   Orig from [place].  Retired Software engineer.   Exercise limited b/c of knee: tries to do bicycle some.   No T/A/Ds.    REVIEW OF SYSTEMS: Constitutional: No fevers, chills, or sweats, no generalized fatigue, change in appetite Eyes: No visual changes, double vision, eye pain Ear, nose and throat: No hearing loss, ear pain, nasal congestion, sore  throat Cardiovascular: No chest pain, palpitations Respiratory:  No shortness of breath at rest or with exertion, wheezes GastrointestinaI: No nausea, vomiting, diarrhea, abdominal pain, fecal incontinence Genitourinary:  No dysuria, urinary retention or frequency Musculoskeletal:  No neck pain, back pain Integumentary: No rash, pruritus, skin lesions Neurological: as above Psychiatric: No depression, insomnia, anxiety Endocrine: No palpitations, fatigue, diaphoresis, mood swings, change in appetite, change in weight, increased thirst Hematologic/Lymphatic:  No anemia, purpura, petechiae. Allergic/Immunologic: no itchy/runny eyes, nasal congestion, recent allergic reactions, rashes  PHYSICAL EXAM: Filed Vitals:   06/22/15 1134  BP: 120/80  Pulse: 87  Resp: 16   General: No acute distress Head:  Normocephalic/atraumatic Eyes: Fundoscopic exam shows bilateral sharp discs, no vessel changes, exudates, or hemorrhages Neck: supple, no paraspinal tenderness, full range of motion Back: No paraspinal tenderness Heart: regular rate and rhythm Lungs: Clear to auscultation bilaterally. Vascular: No carotid bruits. Skin/Extremities: No rash, no edema Neurological Exam: Mental status: alert and oriented to person, place, and time, no dysarthria. He is able to read, write, repeat, name objects. He is noted to have mild expressive aphasia with some word-finding difficulties during the visit. Fund of knowledge is appropriate.  Remote memory intact. 0/3 delayed recall.  Attention and concentration are normal.   Cranial nerves: CN I: not tested CN II: pupils equal, round and reactive to light, visual fields intact, fundi unremarkable. CN III, IV, VI:  full range of motion, no nystagmus, no ptosis CN V: facial sensation intact CN VII: upper and lower face symmetric CN VIII: hearing intact to finger rub CN IX, X: gag intact, uvula midline CN XI: sternocleidomastoid and trapezius muscles intact CN  XII: tongue midline Bulk & Tone: normal, no fasciculations. Motor: 5/5 throughout with no pronator drift. Sensation: decreased vibration up to right knee, left ankle, otherwise intact to light touch, cold, pin, joint position sense.  No extinction to double simultaneous stimulation.  Romberg test negative Deep Tendon Reflexes: brisk +3 on right UE, otherwise +2 throughout, no ankle clonus Plantar responses: upgoing toe on right, downgoing on left Cerebellar: no incoordination on finger to nose, heel to shin. 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 He is currently on Keppra 750mg  BID, which is maximum dose for his renal dysfunction. Routine EEG will be ordered today. We discussed the sensory symptoms are suggestive of simple partial seizures, recommend adding a second AED. He will start low dose Lamotrigine ER 25mg  daily for 2 weeks, with uptitration schedule. Side effects, including Kathreen Cosier syndrome, were discussed. We discussed [place] driving laws that indicate that if a patient has an episode of loss of awareness/consciousness, he should not drive until 6 months seizure-free. With the large temporal lobe mass, he is at risk for seizures, and I have recommended he hold off on driving at this time. He will follow-up in 1 month and knows to call our office for any problems. Follow-up with Neurosurgery as scheduled.  Thank you for  allowing me to participate in the care of this patient. Please do not hesitate to call for any questions or concerns.   Ellouise Newer, M.D.  CC: Dr. Anitra Lauth

## 2015-06-22 NOTE — Patient Instructions (Signed)
1. Routine EEG today 2. Continue Keppra 750mg  twice a day 3. Start Lamotrigine ER 25mg : Take 1 tablet daily for 2 weeks, increase to 2 tablets daily for 2 weeks, then increase to 3 tablets daily and continue 4. Follow-up in 1 month

## 2015-06-23 ENCOUNTER — Encounter: Payer: Self-pay | Admitting: Neurology

## 2015-06-23 ENCOUNTER — Telehealth: Payer: Self-pay | Admitting: Family Medicine

## 2015-06-23 DIAGNOSIS — G9389 Other specified disorders of brain: Secondary | ICD-10-CM | POA: Insufficient documentation

## 2015-06-23 NOTE — Telephone Encounter (Signed)
Patient's wife/Helen was notified of results & advisement.

## 2015-06-23 NOTE — Procedures (Signed)
ELECTROENCEPHALOGRAM REPORT  Date of Study: 06/22/2015  Patient's Name: Philip Shields MRN: [id number] Date of Birth: 05-22-1933  Referring Provider: Dr. 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   Medications: Keppra, Decadron, Cozaar, Zocor  Technical Summary: A multichannel digital EEG recording measured by the international 10-20 system with electrodes applied with paste and impedances below 5000 ohms performed in our laboratory with EKG monitoring in an awake and asleep patient.  Hyperventilation and photic stimulation were not performed.  The digital EEG was referentially recorded, reformatted, and digitally filtered in a variety of bipolar and referential montages for optimal display.    Description: The patient is awake and asleep during the recording.  During maximal wakefulness, there is a symmetric, medium voltage 8 Hz posterior dominant rhythm that attenuates with eye opening.  There is focal theta and delta slowing seen over the left hemisphere, maximal over the left temporal region. During drowsiness and sleep, there is an increase in theta slowing of the background.  Vertex waves and sleep spindles were seen.  Hyperventilation and photic stimulation were not performed. There were no epileptiform discharges or electrographic seizures seen.    EKG lead showed irregular rhythm.  Impression: This awake and asleep EEG is abnormal due to focal slowing over the left hemisphere, maximal over the left temporal region.  Clinical Correlation of the above findings indicates focal cerebral dysfunction over the left hemisphere, maximal over the left temporal region, suggestive of underlying structural or physiologic abnormality, consistent with left temporal mass lesion. The absence of epileptiform discharges does not rule out a clinical diagnosis of epilepsy. Clinical correlation is  advised.   Ellouise Newer, M.D.

## 2015-06-23 NOTE — Telephone Encounter (Signed)
-----   Message from Cameron Sprang, MD sent at 06/23/2015  1:22 PM EDT ----- Pls let him know EEG did not show any seizures during the time he had the study, it showed changes consistent with the tumor. Continue with starting Lamictal and continuing Keppra. Thanks

## 2015-06-27 ENCOUNTER — Ambulatory Visit: Payer: Medicare Other | Admitting: Neurology

## 2015-06-28 ENCOUNTER — Encounter: Payer: Self-pay | Admitting: Radiation Oncology

## 2015-06-28 NOTE — Progress Notes (Signed)
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  Patient presented with symptoms of:  "ghost type" recurrent feelings, sweet smell and taste changes lasting for 35-40 sec, numbness in left hand, difficulty finding words,   Past or anticipated interventions, if any, per neurosurgery: tumor not in area conducive for biopsy and surgery may render patient worse  Past or anticipated interventions, if any, per medical oncology: no  Dose of Decadron, if applicable: Decadron 4 mg every six hours, Keppra 750 mg bid, and Lamotrigine ER 25 mg daily (for 2 weeks)  Recent neurologic symptoms, if any:   Seizures: yes; reports frequency and intensity of seizures are less with decadron, keppra and lamotrigine  Headaches: no  Nausea: no  Dizziness/ataxia: no  Difficulty with hand coordination: no  Focal numbness/weakness: no  Visual deficits/changes: no  Confusion/Memory deficits: no  Painful bone metastases at present, if any: no  SAFETY ISSUES:  Prior radiation? no  Pacemaker/ICD? no  Possible current pregnancy? no  Is the patient on methotrexate? no  Additional Complaints / other details: 81 year old male. Retired Software engineer. Married with two sons that reside one in [place] and one in [place]. Hx of renal insufficiency thus MRI with contrast had to be avoided. No pain. Reports taste changes. Reports that Dr. Valere Dross is the son in law of the wife's best friend.

## 2015-06-29 ENCOUNTER — Ambulatory Visit
Admission: RE | Admit: 2015-06-29 | Discharge: 2015-06-29 | Disposition: A | Discharge status: Home / Self Care | Payer: Medicare Other | Source: Ambulatory Visit | Attending: Radiation Oncology | Admitting: Radiation Oncology

## 2015-06-29 ENCOUNTER — Encounter: Payer: Self-pay | Admitting: Radiation Oncology

## 2015-06-29 VITALS — BP 137/67 | HR 85 | Resp 16 | Wt 215.4 lb

## 2015-06-29 DIAGNOSIS — C712 Malignant neoplasm of temporal lobe: Secondary | ICD-10-CM | POA: Diagnosis present

## 2015-06-29 DIAGNOSIS — N184 Chronic kidney disease, stage 4 (severe): Secondary | ICD-10-CM | POA: Insufficient documentation

## 2015-06-29 DIAGNOSIS — E785 Hyperlipidemia, unspecified: Secondary | ICD-10-CM | POA: Insufficient documentation

## 2015-06-29 DIAGNOSIS — K635 Polyp of colon: Secondary | ICD-10-CM | POA: Insufficient documentation

## 2015-06-29 DIAGNOSIS — N400 Enlarged prostate without lower urinary tract symptoms: Secondary | ICD-10-CM | POA: Diagnosis not present

## 2015-06-29 DIAGNOSIS — I129 Hypertensive chronic kidney disease with stage 1 through stage 4 chronic kidney disease, or unspecified chronic kidney disease: Secondary | ICD-10-CM | POA: Diagnosis not present

## 2015-06-29 DIAGNOSIS — D631 Anemia in chronic kidney disease: Secondary | ICD-10-CM | POA: Diagnosis not present

## 2015-06-29 DIAGNOSIS — F419 Anxiety disorder, unspecified: Secondary | ICD-10-CM | POA: Diagnosis not present

## 2015-06-29 DIAGNOSIS — Z510 Encounter for antineoplastic radiation therapy: Secondary | ICD-10-CM | POA: Insufficient documentation

## 2015-06-29 DIAGNOSIS — C801 Malignant (primary) neoplasm, unspecified: Secondary | ICD-10-CM

## 2015-06-29 HISTORY — DX: Malignant neoplasm of brain, unspecified: C71.9

## 2015-06-29 NOTE — Progress Notes (Signed)
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    ICD-9-CM ICD-10-CM   1. Malignant neoplasm of the left temporal lobe of brain 191.2 C71.2     HISTORY OF PRESENT ILLNESS::Philip Shields is a 81 y.o. male who was presented at the multidisciplinary brain conference this morning. For 3-4 months, the patient's mental functioning has been abnormal with onset confusion and hallucinations, as well as abnormal hearing and smelling. An MRI scan was done on 8/18. They were unable to give contrast due to patient's kidney function. This MRI revealed a mass in the left temporal lobe with a lot of swelling and edema in the left temporal lobe. This swelling has caused a 6 mm mid line shift. A CT scan of abdomen and pelvis was performed on 8/19, which showed no obvious evidence of cancer, however there were a few liver cysts that may need attention or close monitoring. The patient met with neurosurgeon, Dr. Cyndy Freeze. Philip Shields was hospitalized last Thursday night at [facility], 8/25, due to hand tremors and confusion. Another MRI was done that night, showing the same. During this hospital stay the patient was prescribed, Dexamethadone 4 mg 4x daily and Keppra 1500 2x daily. After starting the new medications, the hallucinations resolved. The patient saw Dr. Cyndy Freeze on 8/23 in office.  PREVIOUS RADIATION THERAPY: No  PAST MEDICAL HISTORY:  has a past medical history of Arthritis; Anxiety; GERD (gastroesophageal reflux disease); Hyperlipidemia; Anemia of chronic renal failure; Chronic renal insufficiency, stage IV (severe) (2014/15); BPH with elevated PSA; Hypertensive chronic kidney disease;  Colon polyp (2005); Palpitations; Fuchs' corneal dystrophy; Essential hypertension; Hard of hearing; Seasonal allergic rhinitis; and Brain cancer.    PAST SURGICAL HISTORY: Past Surgical History  Procedure Laterality Date  . Transthoracic echocardiogram  11/2010    Concentric LVH, diastolic dysfunction.  EF normal.  Mild mitral regurg.  . Acromioplasty  04/03/02    Left, with repair of complete rotator cuff repair  . Quadriceps tendon repair  2002    Both knees, right most recent  . Lumbar laminectomy  2001    L4  . Knee surgery      Bilateral  . Transthoracic echocardiogram      EF 60-65%, mod LVH, normal valves.  . Carotid dopplers  06/14/15    No stenosis  . Rotator cuff repair      Right    FAMILY HISTORY: family history includes Cancer in his father; Diabetes in his mother; Heart disease in his mother; Hyperlipidemia in his other; Hypertension in his other.  SOCIAL HISTORY:  Social History   Social History  . Marital Status: Married    Spouse Name: N/A  . Number of Children: 2  . Years of Education: N/A   Occupational History  . Retired    Social History Main Topics  . Smoking status: Never Smoker   . Smokeless tobacco: Never Used  . Alcohol Use: No  . Drug Use: No  . Sexual Activity: Not Currently   Other Topics Concern  . Not on file   Social History Narrative   Married.   Two sons.   Orig from [place].  Retired Software engineer.  Exercise limited b/c of knee: tries to do bicycle some.   No T/A/Ds.    ALLERGIES: Review of patient's allergies indicates no known allergies.  MEDICATIONS:  Current Outpatient Prescriptions  Medication Sig Dispense Refill  . calcitRIOL (ROCALTROL) 0.25 MCG capsule Take 1 capsule by mouth Every morning.    . clotrimazole-betamethasone (LOTRISONE) cream Apply 1 application topically 2 (two) times daily. 45 g 1  . dexamethasone (DECADRON) 4 MG tablet Take 1 tablet (4 mg total) by mouth 4 (four) times daily. 80 tablet 0  .  diclofenac sodium (VOLTAREN) 1 % GEL Apply 2 g topically 4 (four) times daily. 100 g 5  . diphenoxylate-atropine (LOMOTIL) 2.5-0.025 MG per tablet take 1 or 2 tablets by mouth four times a day if needed to PREVENT diarrhea 100 tablet 5  . fexofenadine (ALLEGRA) 180 MG tablet Take 180 mg by mouth daily as needed for allergies or rhinitis.    . fluticasone (FLONASE) 50 MCG/ACT nasal spray 2 sprays each nostril qd (Patient taking differently: Place 2 sprays into both nostrils daily. ) 48 g 0  . hydrALAZINE (APRESOLINE) 25 MG tablet Take 1 tablet (25 mg total) by mouth every 6 (six) hours. 120 tablet 0  . ketoconazole (NIZORAL) 2 % shampoo Apply to affected area three times per week as needed 240 mL 6  . Ketotifen Fumarate (ZADITOR OP) Apply 1 drop to eye daily as needed (DRY EYES).     . LamoTRIgine (LAMICTAL XR) 25 MG TB24 tablet Take 1 tablet daily for 2 weeks, then increase to 2 tablets daily for 2 weeks, then increase to 3 tablets daily 90 tablet 4  . levETIRAcetam (KEPPRA) 750 MG tablet Take 1 tablet (750 mg total) by mouth 2 (two) times daily. 60 tablet 2  . losartan (COZAAR) 50 MG tablet take 1 tablet by mouth once daily 90 tablet 1  . omeprazole (PRILOSEC) 20 MG capsule 20 mg. Take 1 capsule daily  0  . ranitidine (ZANTAC) 150 MG tablet Take 150 mg by mouth daily as needed for heartburn.    . simvastatin (ZOCOR) 40 MG tablet take 1 tablet by mouth at bedtime 90 tablet 1  . tamsulosin (FLOMAX) 0.4 MG CAPS capsule take 1 capsule by mouth at bedtime 90 capsule 1  . valACYclovir (VALTREX) 1000 MG tablet take 2 tablets by mouth IMMEDIATELY AT ONSET OF LESION. REPEAT IN 12 HOURS 30 tablet 1  . HYDROcodone-acetaminophen (NORCO) 10-325 MG per tablet take 1/2 to 1 tablet by mouth every 6 hours if needed (Patient not taking: Reported on 06/29/2015) 100 tablet 0  . prochlorperazine (COMPAZINE) 10 MG tablet Take 1 tablet (10 mg total) by mouth every 6 (six) hours as needed for nausea or vomiting. (Patient  not taking: Reported on 06/29/2015) 30 tablet 5   No current facility-administered medications for this encounter.    REVIEW OF SYSTEMS:  A 15 point review of systems is documented in the electronic medical record. This was obtained by the nursing staff. However, I reviewed this with the patient to discuss relevant findings and make appropriate changes.  Pertinent items are noted in HPI.  No pain. Reports taste changes.   PHYSICAL EXAM:  weight is 215 lb 6.4 oz (97.705 kg). His blood pressure is 137/67 and his pulse is 85. His respiration is 16.   NAD, neuro intact  KPS = 80  100 - Normal; no complaints; no evidence of disease. 90   - Able to carry on normal activity; minor signs or  symptoms of disease. 80   - Normal activity with effort; some signs or symptoms of disease. 77   - Cares for self; unable to carry on normal activity or to do active work. 60   - Requires occasional assistance, but is able to care for most of his personal needs. 50   - Requires considerable assistance and frequent medical care. 20   - Disabled; requires special care and assistance. 67   - Severely disabled; hospital admission is indicated although death not imminent. 39   - Very sick; hospital admission necessary; active supportive treatment necessary. 10   - Moribund; fatal processes progressing rapidly. 0     - Dead  Karnofsky DA, Abelmann [place], Craver LS and Burchenal JH [phone]) The use of the nitrogen mustards in the palliative treatment of carcinoma: with particular reference to bronchogenic carcinoma Cancer 1 634-56  LABORATORY DATA:  Lab Results  Component Value Date   WBC 11.7* 06/17/2015   HGB 11.1* 06/17/2015   HCT 32.7* 06/17/2015   MCV 89.6 06/17/2015   PLT 199 06/17/2015   Lab Results  Component Value Date   NA 137 06/17/2015   K 4.0 06/17/2015   CL 105 06/17/2015   CO2 25 06/17/2015   Lab Results  Component Value Date   ALT 15* 06/16/2015   AST 18 06/16/2015   ALKPHOS 45 06/16/2015    BILITOT 0.6 06/16/2015     RADIOGRAPHY: Ct Abdomen Pelvis Wo Contrast  06/17/2015   CLINICAL DATA:  81 year old male with left temporal lobe masslike signal abnormality on recent brain imaging. Acute renal failure.  EXAM: CT ABDOMEN AND PELVIS WITHOUT CONTRAST  TECHNIQUE: Multidetector CT imaging of the abdomen and pelvis was performed following the standard protocol without IV contrast.  COMPARISON:  None.  FINDINGS: Lower chest: Subsegmental scarring versus atelectasis in the lower lobe bases.  Hepatobiliary: There is an indeterminate 2.4 x 2.0 cm hypodense segment 8 right liver lobe mass (series 2/image 14). Otherwise normal liver, with no additional liver masses. Normal gallbladder, with no radiopaque cholelithiasis. No biliary ductal dilatation.  Pancreas: There are clustered tiny coarse calcifications in the pancreatic head, which are nonspecific and could represent vascular calcifications or less likely evidence of chronic pancreatitis. Mild diffuse fatty infiltration of the otherwise normal pancreas.  Spleen: Normal.  Adrenals/Urinary Tract: Normal adrenals. Simple 1.1 cm renal cyst in the lower lateral right kidney. Simple 1.2 cm renal cyst in the anterior upper right kidney. Simple 3.4 cm renal cyst in the anterior lower left kidney. Subcentimeter hyperdense renal cortical lesions in the lower left kidney are too small to characterize, and are likely to represent benign hemorrhagic renal cysts. No nephrolithiasis. No hydronephrosis. Normal caliber ureters, with no ureteral stones. Normal urinary bladder.  Stomach/Bowel: Collapsed and grossly normal stomach. Normal caliber wall small bowel, with no small bowel wall thickening. Normal appendix. Normal large bowel, with no diverticulosis and no large bowel wall thickening.  Vascular/Lymphatic: Atherosclerotic nonaneurysmal abdominal aorta. No abdominopelvic lymphadenopathy.  Reproductive: Mild prostatomegaly.  Other: No pneumoperitoneum, ascites or  focal fluid collection.  Musculoskeletal: Small fat containing right inguinal hernia. Small fat containing umbilical hernia. Marked degenerative changes in the visualized thoracolumbar spine. There are bilateral pars interarticularis defects at L4 with 11 mm anterolisthesis of L4 on L5. No aggressive appearing focal osseous lesions.  IMPRESSION: 1. Indeterminate 2.4 cm hypodense segment 8 right liver lobe mass, metastasis not excluded. A liver mass protocol MRI or CT of the abdomen with and without intravenous contrast  is necessary to characterize this lesion by imaging. 2. Bilateral benign-appearing renal cysts. 3. Mild prostatomegaly. 4. Small fat containing right inguinal and umbilical hernias.   Electronically Signed   By: Ilona Sorrel M.D.   On: 06/17/2015 09:27   Dg Chest 2 View  06/16/2015   CLINICAL DATA:  Cough for 1 day. Altered mental status for 5 days. Hypertension.  EXAM: CHEST  2 VIEW  COMPARISON:  None.  FINDINGS: There is no edema or consolidation. The heart size and pulmonary vascularity are normal. Aorta is prominent and mildly tortuous. No adenopathy. There is degenerative change in the thoracic spine.  IMPRESSION: Prominence of the aorta is likely secondary to chronic hypertensive change. No edema or consolidation.   Electronically Signed   By: Lowella Grip III M.D.   On: 06/16/2015 18:51   Ct Head Wo Contrast  06/15/2015   CLINICAL DATA:  Aphasia and disorientation.  EXAM: CT HEAD WITHOUT CONTRAST  TECHNIQUE: Contiguous axial images were obtained from the base of the skull through the vertex without intravenous contrast.  COMPARISON:  None.  FINDINGS: There is a large region of abnormal edema involving the entire left temporal lobe extending into the subinsular region. Some edema also extends into the left occipital and parietal lobes. There is some mass effect on the left temporal horn of the lateral ventricle and the mid brain. Increased density at the tip of the temporal lobe may  represent focal parenchymal hemorrhage versus density due to an underlying mass. No hydrocephalus identified. No extra-axial fluid collections. The skull is unremarkable.  IMPRESSION: Large region of abnormal edema involving the left temporal lobe and likely extending into the left occipital and parietal lobes. There does appear to be some associated mass-effect an increased density at the tip of the temporal lobe. Findings are worrisome for under underlying mass lesion versus atypical infarct. Depending on the status of renal function, a contrast-enhanced CT or MRI would be helpful for further evaluation. If the patient cannot receive gadolinium, nonenhanced MRI would also be helpful.  These results will be called to the ordering clinician or representative by the Radiologist Assistant, and communication documented in the PACS or zVision Dashboard.   Electronically Signed   By: Aletta Edouard M.D.   On: 06/15/2015 08:30   Mr Brain Wo Contrast (neuro Protocol)  06/16/2015   CLINICAL DATA:  Initial evaluation for acute worsening aphasia out. Confusion. Left facial droop.  EXAM: MRI HEAD WITHOUT CONTRAST  TECHNIQUE: Multiplanar, multiecho pulse sequences of the brain and surrounding structures were obtained without intravenous contrast.  COMPARISON:  Prior MRI from 06/15/2015.  FINDINGS: No abnormal foci of restricted diffusion to suggest acute intracranial infarct. Normal intravascular flow voids maintained. No acute intracranial hemorrhage. Single tiny chronic micro hemorrhage noted within the right centrum semi ovale. No extra-axial fluid collection.  Again seen is abnormal appearance of the left cerebral hemisphere. Mass light T2/FLAIR hyperintensity involving the left temporal lobe, with involvement of both the white matter and overlying gray matter. Involvement of the left hippocampus. Extension into the left parietal and occipital lobes as well, predominantly involving the white matter. Left insula and  subinsular region as well the left thalamus involved as well. Associated partial effacement of the left lateral ventricle with approximately 6 mm of left-to-right shift. No hydrocephalus. Basilar cisterns remain patent. No infratentorial involvement or extension into the right cerebral hemisphere. Probable mild superimposed chronic small vessel ischemic disease again noted.  Craniocervical junction within normal limits. Incidental note made  of a partially empty sella. Pituitary otherwise unremarkable. No acute abnormality about the orbits. Sequelae of prior bilateral lens extraction.  Mild mucosal thickening within the left maxillary sinus. No mastoid effusion. Inner ear structures grossly normal.  IMPRESSION: 1. Stable appearance of mass slight T2 hyperintensity involving the left temporal lobe with additional left cerebral hemispheric and thalamic involvement as above. 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 Possible encephalitis could also considered. Again, correlate with acuity of clinical presentation and symptomatology. 2. 6 mm of left-to-right midline shift, stable. 3. No new acute abnormality.   Electronically Signed   By: Jeannine Boga M.D.   On: 06/16/2015 22:54   Mr Brain Wo Contrast  06/15/2015   CLINICAL DATA:  Expressive aphasia. Abnormal head CT with left temporal lobe edema. Chronic renal insufficiency.  EXAM: MRI HEAD WITHOUT CONTRAST  TECHNIQUE: Multiplanar, multiecho pulse sequences of the brain and surrounding structures were obtained without intravenous contrast.  COMPARISON:  Head CT 06/14/2015  FINDINGS: A partially empty sella is incidentally noted. There is no evidence of acute infarct or extra-axial fluid collection. There is mild cerebral atrophy which is within normal limits for age. A punctate focus of susceptibility artifact in the high right frontal lobe may reflect a chronic microhemorrhage.  As seen on yesterday's CT, there is an  abnormal appearance of the left cerebral hemisphere. There is masslike T2 hyperintensity in the left temporal lobe, predominantly in the white matter but with cortical involvement as well. There is involvement of the hippocampus. Abnormal T2 signal extends into the left occipital, left parietal, and posterior left frontal lobes, again predominantly involving white matter. There is also involvement of the left insula/ subinsular white matter and left thalamus. There is partial effacement of the left lateral ventricle with approximately 6 mm of rightward midline shift at the level of the thalami, unchanged. The left cerebral hemispheric abnormality does not clearly involve the corpus callosum or extend into the right cerebral hemisphere or brainstem. Small foci of T2 hyperintensity scattered throughout the right cerebral white matter and anterior left frontal white matter are nonspecific but compatible with mild chronic small vessel ischemic disease.  Prior bilateral cataract extraction is noted. There is mild left maxillary sinus mucosal thickening. Major intracranial vascular flow voids are preserved.  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 Encephalitis is an additional consideration. Correlate with acuity of clinical presensation and consider neurosurgical referral and biopsy.  These results will be called to the ordering clinician or representative by the Radiologist Assistant, and communication documented in the PACS or zVision Dashboard.   Electronically Signed   By: Logan Bores   On: 06/15/2015 20:49   US Carotid Bilateral  06/14/2015   CLINICAL DATA:  81 year old male with expressive aphasia  EXAM: BILATERAL CAROTID DUPLEX ULTRASOUND  TECHNIQUE: Pearline Cables scale imaging, color Doppler and duplex ultrasound were performed of bilateral carotid and  vertebral arteries in the neck.  COMPARISON:  None.  FINDINGS: Criteria: Quantification of carotid stenosis is based on velocity parameters that correlate the residual internal carotid diameter with NASCET-based stenosis levels, using the diameter of the distal internal carotid lumen as the denominator for stenosis measurement.  The following velocity measurements were obtained:  RIGHT  ICA:  82/12 cm/sec  CCA:  818/56 cm/sec  SYSTOLIC ICA/CCA RATIO:  0.8  DIASTOLIC ICA/CCA RATIO:  0.9  ECA:  130 cm/sec  LEFT  ICA:  89/25 cm/sec  CCA:  97/5 cm/sec  SYSTOLIC ICA/CCA RATIO:  1.4  DIASTOLIC ICA/CCA RATIO:  2.7  ECA:  113 cm/sec  RIGHT CAROTID ARTERY: Tortuous proximal common carotid anatomy. No significant atherosclerotic plaque or evidence of stenosis.  RIGHT VERTEBRAL ARTERY:  Patent with normal antegrade flow.  LEFT CAROTID ARTERY: No significant atherosclerotic plaque or evidence of stenosis.  LEFT VERTEBRAL ARTERY:  Patent with normal antegrade flow.  IMPRESSION: 1. No evidence of atherosclerotic plaque or significant stenosis in either carotid artery. 2. Vertebral arteries are patent with normal antegrade flow.  Signed,  Criselda Peaches, MD  [facility]  [facility]   Electronically Signed   By: Jacqulynn Cadet M.D.   On: 06/14/2015 16:59      IMPRESSION: 81 yo man with a mass lesion in left hemisphere of the brain which likely represents a malignant glioma, however without contrast and tissue confirmation, the grade and epicenter of the process are unclear. Dr. Cyndy Freeze projects that an anterior temporal lobectomy would need to be performed if biopsy is necessary. This may cause speech complications since it is the dominant temporal lobe.  Mr. Shields would be a good candidate for radiation treatment to the left hemispheric tumor versus Hospice.   PLAN: Today, I talked to the patient and family about the findings and work-up thus far.  We discussed the  natural history of primary glioma, both low and high grade, and general treatment, highlighting the role of radiotherapy in the management.  We discussed the available radiation techniques, and focused on the details of logistics and delivery.  We reviewed the anticipated acute and late sequelae associated with radiation in this setting.  The patient was encouraged to ask questions that I answered to the best of my ability.  The patient would like to proceed with radiation and will be scheduled for CT simulation.  We will continue with simulation and planning on 9/9.     I spent 60 minutes minutes face to face with the patient and more than 50% of that time was spent in counseling and/or coordination of care.   This document serves as a record of services personally performed by Tyler Pita, MD. It was created on his behalf by Arlyce Harman, a trained medical scribe. The creation of this record is based on the scribe's personal observations and the provider's statements to them. This document has been checked and approved by the attending provider.    ------------------------------------------------  Sheral Apley Tammi Klippel, M.D.

## 2015-06-29 NOTE — Progress Notes (Signed)
See progress note under physician encounter. 

## 2015-06-30 DIAGNOSIS — R600 Localized edema: Secondary | ICD-10-CM

## 2015-06-30 DIAGNOSIS — R739 Hyperglycemia, unspecified: Secondary | ICD-10-CM

## 2015-06-30 HISTORY — DX: Hyperglycemia, unspecified: R73.9

## 2015-06-30 HISTORY — DX: Localized edema: R60.0

## 2015-06-30 NOTE — Addendum Note (Signed)
Encounter addended by: Heywood Footman, RN on: 06/30/2015  2:30 PM<BR>     Documentation filed: Charges VN

## 2015-07-01 ENCOUNTER — Ambulatory Visit
Admission: RE | Admit: 2015-07-01 | Discharge: 2015-07-01 | Disposition: A | Discharge status: Home / Self Care | Payer: Medicare Other | Source: Ambulatory Visit | Attending: Radiation Oncology | Admitting: Radiation Oncology

## 2015-07-01 DIAGNOSIS — C712 Malignant neoplasm of temporal lobe: Secondary | ICD-10-CM

## 2015-07-01 DIAGNOSIS — Z510 Encounter for antineoplastic radiation therapy: Secondary | ICD-10-CM | POA: Diagnosis not present

## 2015-07-01 NOTE — Progress Notes (Signed)
  Radiation Oncology         [phone] ________________________________  Name: Philip Shields MRN: [id number]  Date: 07/01/2015  DOB: 10/08/1933  SIMULATION AND TREATMENT PLANNING NOTE    ICD-9-CM ICD-10-CM   1. 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  NARRATIVE:  The patient was brought to the [facility].  Identity was confirmed.  All relevant records and images related to the planned course of therapy were reviewed.  The patient freely provided informed written consent to proceed with treatment after reviewing the details related to the planned course of therapy. The consent form was witnessed and verified by the simulation staff.  Then, the patient was set-up in a stable reproducible  supine position for radiation therapy.  CT images were obtained.  Surface markings were placed.  The CT images were loaded into the planning software.  Then the target and avoidance structures were contoured.  Treatment planning then occurred.  The radiation prescription was entered and confirmed.  Then, I designed and supervised the construction of a total of 1 medically necessary complex treatment devices.  I have requested : Intensity Modulated Radiotherapy (IMRT) is medically necessary for this case for the following reason:  Critical CNS structure avoidance - brainstem, optic chiasm, optic nerve..  I have ordered:Nutrition Consult  PLAN:  The patient will receive 54 Gy in 30 fractions.  This document serves as a record of services personally performed by Tyler Pita, MD. It was created on his behalf by Arlyce Harman, a trained medical scribe. The creation of this record is based on the scribe's personal observations and the provider's statements to them. This document has been checked and approved by the attending provider.     ________________________________  Sheral Apley. Tammi Klippel, M.D.

## 2015-07-07 DIAGNOSIS — Z510 Encounter for antineoplastic radiation therapy: Secondary | ICD-10-CM | POA: Diagnosis not present

## 2015-07-08 ENCOUNTER — Telehealth: Payer: Self-pay | Admitting: *Deleted

## 2015-07-08 ENCOUNTER — Ambulatory Visit: Payer: Medicare Other | Admitting: Radiation Oncology

## 2015-07-08 NOTE — Telephone Encounter (Signed)
Tell him I would love to be able to do this for him but it would be too risky b/c it would potentially make his kidney failure even worse.  Elevate legs above the level of the heart for 20 min two times a day, limit sodium intake to 2g per day or less.  Sorry--PM

## 2015-07-08 NOTE — Telephone Encounter (Signed)
Pt LMOM on 07/08/15 at 1:08pm  Pt stated that he is scheduled to have radiation therapy on Monday. He stated that he has swelling in his ankles and was hoping Dr. Anitra Lauth would send in a short supply of hctz to help with the swelling so he will be more comfortable. Please advise. Thanks.

## 2015-07-08 NOTE — Telephone Encounter (Signed)
Pt advised and voiced understanding.   

## 2015-07-11 ENCOUNTER — Ambulatory Visit: Payer: Medicare Other

## 2015-07-11 ENCOUNTER — Ambulatory Visit
Admission: RE | Admit: 2015-07-11 | Discharge: 2015-07-11 | Disposition: A | Discharge status: Home / Self Care | Payer: Medicare Other | Source: Ambulatory Visit | Attending: Radiation Oncology | Admitting: Radiation Oncology

## 2015-07-11 DIAGNOSIS — Z510 Encounter for antineoplastic radiation therapy: Secondary | ICD-10-CM | POA: Diagnosis not present

## 2015-07-12 ENCOUNTER — Telehealth: Payer: Self-pay | Admitting: Radiation Oncology

## 2015-07-12 ENCOUNTER — Ambulatory Visit
Admission: RE | Admit: 2015-07-12 | Discharge: 2015-07-12 | Disposition: A | Discharge status: Home / Self Care | Payer: Medicare Other | Source: Ambulatory Visit | Attending: Radiation Oncology | Admitting: Radiation Oncology

## 2015-07-12 ENCOUNTER — Ambulatory Visit: Payer: Medicare Other

## 2015-07-12 DIAGNOSIS — Z510 Encounter for antineoplastic radiation therapy: Secondary | ICD-10-CM | POA: Diagnosis not present

## 2015-07-12 NOTE — Telephone Encounter (Signed)
[facility] folder placed on my desk with two papers to complete for this patient. Completed forms to the best of my ability. Took forms in folder to [place] to process. Ailene Ravel will take forms to Dr. Tammi Klippel for signature.

## 2015-07-13 ENCOUNTER — Ambulatory Visit: Payer: Medicare Other

## 2015-07-13 ENCOUNTER — Other Ambulatory Visit: Payer: Self-pay | Admitting: Family Medicine

## 2015-07-13 ENCOUNTER — Encounter: Payer: Self-pay | Admitting: Family Medicine

## 2015-07-13 ENCOUNTER — Ambulatory Visit (INDEPENDENT_AMBULATORY_CARE_PROVIDER_SITE_OTHER): Payer: Medicare Other | Admitting: Family Medicine

## 2015-07-13 ENCOUNTER — Ambulatory Visit
Admission: RE | Admit: 2015-07-13 | Discharge: 2015-07-13 | Disposition: A | Discharge status: Home / Self Care | Payer: Medicare Other | Source: Ambulatory Visit | Attending: Radiation Oncology | Admitting: Radiation Oncology

## 2015-07-13 VITALS — BP 109/74 | HR 105 | Temp 97.7°F | Resp 16 | Ht 69.0 in | Wt 222.0 lb

## 2015-07-13 DIAGNOSIS — N184 Chronic kidney disease, stage 4 (severe): Secondary | ICD-10-CM | POA: Diagnosis not present

## 2015-07-13 DIAGNOSIS — R609 Edema, unspecified: Secondary | ICD-10-CM | POA: Insufficient documentation

## 2015-07-13 DIAGNOSIS — Z510 Encounter for antineoplastic radiation therapy: Secondary | ICD-10-CM | POA: Diagnosis not present

## 2015-07-13 DIAGNOSIS — G9389 Other specified disorders of brain: Secondary | ICD-10-CM

## 2015-07-13 DIAGNOSIS — G939 Disorder of brain, unspecified: Secondary | ICD-10-CM

## 2015-07-13 DIAGNOSIS — Z23 Encounter for immunization: Secondary | ICD-10-CM

## 2015-07-13 LAB — COMPREHENSIVE METABOLIC PANEL
ALT: 114 U/L — ABNORMAL HIGH (ref 0–53)
AST: 34 U/L (ref 0–37)
Albumin: 3.4 g/dL — ABNORMAL LOW (ref 3.5–5.2)
Alkaline Phosphatase: 47 U/L (ref 39–117)
BUN: 74 mg/dL — ABNORMAL HIGH (ref 6–23)
CHLORIDE: 107 meq/L (ref 96–112)
CO2: 18 meq/L — AB (ref 19–32)
Calcium: 8.4 mg/dL (ref 8.4–10.5)
Creatinine, Ser: 2.79 mg/dL — ABNORMAL HIGH (ref 0.40–1.50)
GFR: 23.39 mL/min — AB (ref >60.00)
GLUCOSE: 268 mg/dL — AB (ref 70–99)
POTASSIUM: 5 meq/L (ref 3.5–5.1)
Sodium: 136 mEq/L (ref 135–145)
Total Bilirubin: 0.5 mg/dL (ref 0.2–1.2)
Total Protein: 5.6 g/dL — ABNORMAL LOW (ref 6.0–8.3)

## 2015-07-13 MED ORDER — INSULIN GLARGINE 100 UNIT/ML SOLOSTAR PEN: 10.0000 [IU] | PEN_INJECTOR | Freq: Every day | SUBCUTANEOUS | Status: DC

## 2015-07-13 NOTE — Progress Notes (Signed)
OFFICE VISIT  07/13/2015   CC:  Chief Complaint  Patient presents with  . Edema   HPI:    Patient is a 81 y.o. Caucasian male who presents for lower legs swelling. Pt was seen by nephrologist 06/13/15 and there was concern of possible CVA. MRI noncontrast brain showed L temporal lobe changes c/w mass lesion (infiltrating glioma). Pt eventually presented to ED with worsening of word finding difficulties and hallucinations and was started on decadron and keppra. CT scan abd/pelv 06/17/15 showed no obvious evidence of cancer.  Pt saw neurosurgeon, Dr. Cyndy Freeze, in office 06/21/15.  He feels that an anterior temporal lobectomy would need to be performed if biopsy necessary.  This may cause speech complications since it is his dominant temporal lobe.   He has been seen by the neurologist 8/24 and started on lamictal in addition to his other recent new meds due to suspicion of simple partial seizures (sensory symptoms--taste and smell abnormalities).  He saw rad onc 06/29/15 and has been getting radiation treatments lately to the brain mass (first was 2 d/a and 2nd was earlier today).  Bilat LL swelling just in ankles throughout the day, unclear how long this has been a problem.  He is not sure if it got worse after starting decadron but wife feels like it all started to be noticed about a week after getting on the decadron. Goes down a little after sleeping. Denies feeling much swelling elsewhere.  He has been elevating feet.  Salts his food just a little.  He is not in any pain whatsoever.  Past Medical History  Diagnosis Date  . Arthritis     Knees and low back  . Anxiety   . GERD (gastroesophageal reflux disease)   . Hyperlipidemia   . Anemia of chronic renal failure   . Chronic renal insufficiency, stage IV (severe) 2014/15    CrCl in the 20s as of 02/2014.  Kidney dz secondary to HTN, chronic NSAIDs (excessive kidney scarring noted)--Dr. Deterding.  Cr 2.2-2.6 range.  Marland Kitchen BPH with elevated  PSA   . Hypertensive chronic kidney disease   . Colon polyp 2005    Not sent to path; repeat recommended 5 yrs, so repeat colonoscopy 03/2010 was normal  . Palpitations     Worked up by Dr. Gwenlyn Found (myoview neg, echo normal, 5mo event monitor unremarkable: no new  meds were recommended.  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 Essential hypertension   . Hard of hearing   . Seasonal allergic rhinitis   . 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 Transthoracic echocardiogram  11/2010    Concentric LVH, diastolic dysfunction.  EF normal.  Mild mitral regurg.  . Acromioplasty  04/03/02    Left, with repair of complete rotator cuff repair  . Quadriceps tendon repair  2002    Both knees, right most recent  . Lumbar laminectomy  2001    L4  . Knee surgery      Bilateral  . Transthoracic echocardiogram  05/2015    EF 60-65%, mod LVH, normal valves.  . Carotid dopplers  06/14/15    No stenosis  . Rotator cuff repair      Right    Outpatient Prescriptions Prior to Visit  Medication Sig Dispense Refill  . calcitRIOL (ROCALTROL) 0.25 MCG capsule Take 1 capsule by mouth Every morning.    Marland Kitchen  clotrimazole-betamethasone (LOTRISONE) cream Apply 1 application topically 2 (two) times daily. 45 g 1  . dexamethasone (DECADRON) 4 MG tablet Take 1 tablet (4 mg total) by mouth 4 (four) times daily. 80 tablet 0  . fexofenadine (ALLEGRA) 180 MG tablet Take 180 mg by mouth daily as needed for allergies or rhinitis.    . hydrALAZINE (APRESOLINE) 25 MG tablet Take 1 tablet (25 mg total) by mouth every 6 (six) hours. 120 tablet 0  . LamoTRIgine (LAMICTAL XR) 25 MG TB24 tablet Take 1 tablet daily for 2 weeks, then increase to 2 tablets daily for 2 weeks, then increase to 3 tablets daily 90 tablet 4  . levETIRAcetam (KEPPRA) 750 MG tablet Take 1 tablet (750 mg total) by mouth 2 (two) times daily. 60  tablet 2  . losartan (COZAAR) 50 MG tablet take 1 tablet by mouth once daily 90 tablet 1  . omeprazole (PRILOSEC) 20 MG capsule 20 mg. Take 1 capsule daily  0  . prochlorperazine (COMPAZINE) 10 MG tablet Take 1 tablet (10 mg total) by mouth every 6 (six) hours as needed for nausea or vomiting. 30 tablet 5  . ranitidine (ZANTAC) 150 MG tablet Take 150 mg by mouth daily as needed for heartburn.    . simvastatin (ZOCOR) 40 MG tablet take 1 tablet by mouth at bedtime 90 tablet 1  . tamsulosin (FLOMAX) 0.4 MG CAPS capsule take 1 capsule by mouth at bedtime 90 capsule 1  . valACYclovir (VALTREX) 1000 MG tablet take 2 tablets by mouth IMMEDIATELY AT ONSET OF LESION. REPEAT IN 12 HOURS 30 tablet 1  . diclofenac sodium (VOLTAREN) 1 % GEL Apply 2 g topically 4 (four) times daily. (Patient not taking: Reported on 07/13/2015) 100 g 5  . diphenoxylate-atropine (LOMOTIL) 2.5-0.025 MG per tablet take 1 or 2 tablets by mouth four times a day if needed to PREVENT diarrhea (Patient not taking: Reported on 07/13/2015) 100 tablet 5  . fluticasone (FLONASE) 50 MCG/ACT nasal spray 2 sprays each nostril qd (Patient not taking: Reported on 07/13/2015) 48 g 0  . HYDROcodone-acetaminophen (NORCO) 10-325 MG per tablet take 1/2 to 1 tablet by mouth every 6 hours if needed (Patient not taking: Reported on 06/29/2015) 100 tablet 0  . ketoconazole (NIZORAL) 2 % shampoo Apply to affected area three times per week as needed (Patient not taking: Reported on 07/13/2015) 240 mL 6  . Ketotifen Fumarate (ZADITOR OP) Apply 1 drop to eye daily as needed (DRY EYES).      No facility-administered medications prior to visit.    No Known Allergies  ROS As per HPI  PE: Blood pressure 109/74, pulse 105, temperature 97.7 F (36.5 C), temperature source Oral, resp. rate 16, height 5\' 9"  (1.753 m), weight 222 lb (100.699 kg), SpO2 94 %. Gen: Alert, well appearing.  Patient is oriented to person, place, time, and situation. Face appears fuller  than in past visits. ZOX:WRUE: no injection, icteris, swelling, or exudate.  EOMI, PERRLA. Mouth: lips without lesion/swelling.  Oral mucosa pink and moist. Oropharynx without erythema, exudate, or swelling.  CV: RRR, no m/r/g.   LUNGS: CTA bilat, nonlabored resps, good aeration in all lung fields. ABD: soft, NT/ND, no pitting edema EXT: no cyanosis or clubbing.  He has 3+ pitting edema from the knees down into feet bilaterally, no erythema or rash of LL's.  No skin breakdown or weeping of fluid from skin. Neuro: Neuro: CN 2-12 intact bilaterally, strength 5/5 in proximal and distal upper extremities and lower  extremities bilaterally.    No tremor.  No ataxia. No pronator drift. He has some word finding difficulties in our conversation today. He denies any hallucinations currently.   LABS:    Chemistry      Component Value Date/Time   NA 137 06/17/2015 0510   K 4.0 06/17/2015 0510   CL 105 06/17/2015 0510   CO2 25 06/17/2015 0510   BUN 30* 06/17/2015 0510   CREATININE 2.48* 06/17/2015 0510      Component Value Date/Time   CALCIUM 8.2* 06/17/2015 0510   ALKPHOS 45 06/16/2015 1800   AST 18 06/16/2015 1800   ALT 15* 06/16/2015 1800   BILITOT 0.6 06/16/2015 1800     Lab Results  Component Value Date   WBC 11.7* 06/17/2015   HGB 11.1* 06/17/2015   HCT 32.7* 06/17/2015   MCV 89.6 06/17/2015   PLT 199 06/17/2015   IMPRESSION AND PLAN:  1) Bilat LL edema; suspect he has significant fluid retention coming from his high dose steroids that he started a few weeks ago.  Discussed importance of Na limitation, elevation of legs, gave rx for compression stockings, and discussed possible use (tentative) of diuretics to help alleviate some of this.  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 Tissue dx unatainable. Pt getting radiation therapy, continues on high dose decadron. Also continues on keppra and lamictal for treatment of simple partial seizures assoc with this  mass.  3) CRI,stage 4.  This has been stable over the last couple years, seeing Dr. Jimmy Footman. I am hesitant to use diuretics in him for fear of making his renal function acutely worse. Will see what BUN/Cr are currently and may try low dose lasix every 2-3 days and see how kidneys respond.  Pt expressed understanding and agreement with plan.  An After Visit Summary was printed and given to the patient.  FOLLOW UP: Return in about 2 weeks (around 07/27/2015).

## 2015-07-13 NOTE — Addendum Note (Signed)
Addended by: Lanae Crumbly on: 07/13/2015 03:08 PM   Modules accepted: Orders

## 2015-07-13 NOTE — Progress Notes (Signed)
Pre visit review using our clinic review tool, if applicable. No additional management support is needed unless otherwise documented below in the visit note. 

## 2015-07-14 ENCOUNTER — Encounter: Payer: Self-pay | Admitting: *Deleted

## 2015-07-14 ENCOUNTER — Ambulatory Visit
Admission: RE | Admit: 2015-07-14 | Discharge: 2015-07-14 | Disposition: A | Discharge status: Home / Self Care | Payer: Medicare Other | Source: Ambulatory Visit | Attending: Radiation Oncology | Admitting: Radiation Oncology

## 2015-07-14 ENCOUNTER — Other Ambulatory Visit: Payer: Self-pay | Admitting: *Deleted

## 2015-07-14 ENCOUNTER — Telehealth: Payer: Self-pay | Admitting: Family Medicine

## 2015-07-14 ENCOUNTER — Ambulatory Visit: Payer: Medicare Other

## 2015-07-14 DIAGNOSIS — Z510 Encounter for antineoplastic radiation therapy: Secondary | ICD-10-CM | POA: Diagnosis not present

## 2015-07-14 DIAGNOSIS — R739 Hyperglycemia, unspecified: Secondary | ICD-10-CM | POA: Insufficient documentation

## 2015-07-14 NOTE — Telephone Encounter (Signed)
See Lab note for 07/13/15.

## 2015-07-14 NOTE — Telephone Encounter (Signed)
Pt received a call from [place] stating that Jaleil had a rx for insulin to be picked up. They are concerned because they did not know Cortlin was supoosed to take insulin. Please call and clarify cfor them.

## 2015-07-15 ENCOUNTER — Ambulatory Visit: Payer: Medicare Other

## 2015-07-15 ENCOUNTER — Telehealth: Payer: Self-pay | Admitting: Radiation Oncology

## 2015-07-15 ENCOUNTER — Ambulatory Visit
Admission: RE | Admit: 2015-07-15 | Discharge: 2015-07-15 | Disposition: A | Discharge status: Home / Self Care | Payer: Medicare Other | Source: Ambulatory Visit | Attending: Radiation Oncology | Admitting: Radiation Oncology

## 2015-07-15 ENCOUNTER — Encounter: Payer: Self-pay | Admitting: Radiation Oncology

## 2015-07-15 VITALS — BP 150/75 | HR 108 | Resp 16 | Wt 220.0 lb

## 2015-07-15 DIAGNOSIS — Z510 Encounter for antineoplastic radiation therapy: Secondary | ICD-10-CM | POA: Diagnosis not present

## 2015-07-15 DIAGNOSIS — C712 Malignant neoplasm of temporal lobe: Secondary | ICD-10-CM

## 2015-07-15 MED ORDER — DEXAMETHASONE 4 MG PO TABS: 4.0000 mg | ORAL_TABLET | Freq: Three times a day (TID) | ORAL | Status: AC

## 2015-07-15 MED ORDER — BIAFINE EX EMUL
Freq: Every day | CUTANEOUS | Status: DC
  Administered 2015-07-15: 15:00:00 via TOPICAL

## 2015-07-15 NOTE — Telephone Encounter (Signed)
Per Dr. Johny Shears order called in Decadron 4 mg tid, qty 89 and no refills to [place] at [facility].

## 2015-07-15 NOTE — Progress Notes (Signed)
  Radiation Oncology         [phone]   Name: Philip Shields MRN: [id number]   Date: 07/15/2015  DOB: [birth year]   Weekly Radiation Therapy Management    ICD-9-CM ICD-10-CM   1. 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 The patient's weight and vitals are stable. He denies symptoms of pain. Reports ghost like occurences and taste changes are happening less frequently. The patient recently attended a follow-up appointment with his PCP in regards to the edema in his ankles and was prescribed compression socks. Also, he was prescribed Solstar to control elevated bp. He reports symptoms of drowsiness throughout the day and difficulty finding words. The patient administers Decadron 4 mg tid and Lamictal. He denies the common symptom of rash from the medication Lamictal. He has a tremor now and again. Set-up films were reviewed. The chart was checked. The patient was accompanied by his wife.    Physical Findings  weight is 220 lb (99.791 kg). His blood pressure is 150/75 and his pulse is 108. His respiration is 16. . The patient's weight is currently essentially stable.There is no significant changes to the status of the paients overall health to be noted at this time. The patient is alert and oriented x3. No thrush was noted upon physical examination.   Impression Philip Shields is a 81 year old gentleman presenting to clinic in regards to his malignant neoplasm of the left temporal lobe of brain. The patient is tolerating radiation. The patient understands the importance of administering his medications and steroids as prescribed. The patient understands that his can access he appointments and medical records via [place].   Plan The patient is advised to continue treatment as  planned. A prescription of the medication dexamethasone was provided today and sent to the [facility] on [address] ([address]) of [place], [place]. The patient is instructed to continue the administration of prescribed medications and steroids as appropriate. The patient is advised of his follow-up appointment with radiation oncology to take place next Friday (07/15/2015) has scheduled. All vocalized questions and concerns have been addressed. If the patient develops any further questions or concerns in regards to his treatment and recovery, he has been encouraged to contact Dr. Tammi Klippel, MD.     This document serves as a record of services personally performed by Tyler Pita, MD. It was created on his behalf by Lenn Cal, a trained medical scribe. The creation of this record is based on the scribe's personal observations and the provider's statements to them. This document has been checked and approved by the attending provider.  _________________________________________________   Sheral Apley. Tammi Klippel, M.D.

## 2015-07-15 NOTE — Progress Notes (Addendum)
Weight and vitals stable. Denies pain. Reports ghost like occurences and taste changes are happening less frequently. Recently saw PCP for edema in ankles. Was prescribed compression socks. Also, he was prescribed Solstar to control elevated blood sugar. Reports drowsiness throughout the day. Reports difficulty finding words continues. Reports decadron 4 mg tid. Reports using Lamictal. Denies a rash from the Lamictal. No thrush noted. Oriented patient and his wife to staff and routine of the clinic. Provided patient with Biafine cream and directed upon use. Provided patient with RADIATION THERAPY AND YOU handbook then, reviewed pertinent information. Educated patient reference potential side effects and management such as, fatigue, skin changes and headache. Encouraged patient to contact this RN with future needs. Patient verbalized understanding of all reviewed.   BP 150/75 mmHg  Pulse 108  Resp 16  Wt 220 lb (99.791 kg) Wt Readings from Last 3 Encounters:  07/15/15 220 lb (99.791 kg)  07/13/15 222 lb (100.699 kg)  06/29/15 215 lb 6.4 oz (97.705 kg)

## 2015-07-18 ENCOUNTER — Ambulatory Visit: Payer: Medicare Other

## 2015-07-18 ENCOUNTER — Ambulatory Visit
Admission: RE | Admit: 2015-07-18 | Discharge: 2015-07-18 | Disposition: A | Discharge status: Home / Self Care | Payer: Medicare Other | Source: Ambulatory Visit | Attending: Radiation Oncology | Admitting: Radiation Oncology

## 2015-07-18 ENCOUNTER — Other Ambulatory Visit: Payer: Self-pay | Admitting: *Deleted

## 2015-07-18 ENCOUNTER — Other Ambulatory Visit: Payer: Self-pay | Admitting: Radiation Oncology

## 2015-07-18 DIAGNOSIS — Z510 Encounter for antineoplastic radiation therapy: Secondary | ICD-10-CM | POA: Diagnosis not present

## 2015-07-18 MED ORDER — INSULIN PEN NEEDLE 32G X 4 MM MISC: 1.0000 | Freq: Every day | Status: AC

## 2015-07-19 ENCOUNTER — Ambulatory Visit
Admission: RE | Admit: 2015-07-19 | Discharge: 2015-07-19 | Disposition: A | Discharge status: Home / Self Care | Payer: Medicare Other | Source: Ambulatory Visit | Attending: Radiation Oncology | Admitting: Radiation Oncology

## 2015-07-19 ENCOUNTER — Ambulatory Visit: Payer: Medicare Other

## 2015-07-19 DIAGNOSIS — Z510 Encounter for antineoplastic radiation therapy: Secondary | ICD-10-CM | POA: Diagnosis not present

## 2015-07-19 NOTE — Addendum Note (Signed)
Encounter addended by: Heywood Footman, RN on: 07/19/2015  1:47 PM<BR>     Documentation filed: Notes Section

## 2015-07-19 NOTE — Addendum Note (Signed)
Encounter addended by: Heywood Footman, RN on: 07/19/2015  1:53 PM<BR>     Documentation filed: Inpatient Patient Education

## 2015-07-20 ENCOUNTER — Ambulatory Visit
Admission: RE | Admit: 2015-07-20 | Discharge: 2015-07-20 | Disposition: A | Discharge status: Home / Self Care | Payer: Medicare Other | Source: Ambulatory Visit | Attending: Radiation Oncology | Admitting: Radiation Oncology

## 2015-07-20 ENCOUNTER — Ambulatory Visit: Payer: Medicare Other

## 2015-07-20 DIAGNOSIS — Z510 Encounter for antineoplastic radiation therapy: Secondary | ICD-10-CM | POA: Diagnosis not present

## 2015-07-21 ENCOUNTER — Ambulatory Visit: Payer: Medicare Other

## 2015-07-21 ENCOUNTER — Encounter: Payer: Self-pay | Admitting: Radiation Oncology

## 2015-07-21 ENCOUNTER — Telehealth: Payer: Self-pay | Admitting: Radiation Oncology

## 2015-07-21 ENCOUNTER — Ambulatory Visit
Admission: RE | Admit: 2015-07-21 | Discharge: 2015-07-21 | Disposition: A | Discharge status: Home / Self Care | Payer: Medicare Other | Source: Ambulatory Visit | Attending: Radiation Oncology | Admitting: Radiation Oncology

## 2015-07-21 DIAGNOSIS — Z510 Encounter for antineoplastic radiation therapy: Secondary | ICD-10-CM | POA: Diagnosis not present

## 2015-07-21 NOTE — Telephone Encounter (Signed)
Phoned patient's wife, Bonnita Nasuti. Explained paperwork is complete and ready for pick up in the radiation waiting area. Explained the paperwork is complete but, has not been faxed because it has to be signed by Mr. Hu first. She verbalized understanding and her intention to mail the papers herself.

## 2015-07-21 NOTE — Progress Notes (Signed)
Forms left at front desk to be given to patient's wife. Forms to be scanned

## 2015-07-22 ENCOUNTER — Ambulatory Visit
Admission: RE | Admit: 2015-07-22 | Discharge: 2015-07-22 | Disposition: A | Discharge status: Home / Self Care | Payer: Medicare Other | Source: Ambulatory Visit | Attending: Radiation Oncology | Admitting: Radiation Oncology

## 2015-07-22 ENCOUNTER — Telehealth: Payer: Self-pay | Admitting: Radiation Oncology

## 2015-07-22 ENCOUNTER — Ambulatory Visit: Payer: Medicare Other

## 2015-07-22 ENCOUNTER — Encounter: Payer: Self-pay | Admitting: Radiation Oncology

## 2015-07-22 VITALS — BP 136/73 | HR 112 | Resp 16 | Wt 215.0 lb

## 2015-07-22 DIAGNOSIS — C712 Malignant neoplasm of temporal lobe: Secondary | ICD-10-CM

## 2015-07-22 DIAGNOSIS — Z510 Encounter for antineoplastic radiation therapy: Secondary | ICD-10-CM | POA: Diagnosis not present

## 2015-07-22 DIAGNOSIS — R609 Edema, unspecified: Secondary | ICD-10-CM

## 2015-07-22 LAB — GLUCOSE, CAPILLARY: GLUCOSE-CAPILLARY: 309 mg/dL — AB (ref 65–99)

## 2015-07-22 MED ORDER — FUROSEMIDE 20 MG PO TABS: 20.0000 mg | ORAL_TABLET | ORAL | Status: AC

## 2015-07-22 NOTE — Telephone Encounter (Signed)
The below email was sent to the patient's wife, Philip Shields, as requested.   Philip Shields.   I hope your appointment went well today. Below is Dr. Johny Shears note from the encounter. Also, please be aware that Philip Shields's blood sugar today was 309. I would strongly encourage you to reach out to his PCP and make him aware of this elevated level. He may want to increase his Solostar. Philip Shields brother did not come in with him but, Philip Shields navigated things very well. I walked him to the door upstairs when he left.   The patient is advised to continue treatment as planned. A prescription of the medication, Lasix 20 mg, was provided today and sent to the [facility] on [address] ([address]) of [place], [place]. The patient is instructed to continue the administration of prescribed medications and steroids as appropriate. The patient is advised of his follow-up appointment with radiation oncology as scheduled.   Please send me an email response confirming you received this. I hope you both have a wonderful weekend. Thank you.  Heywood Footman, RN, BSN

## 2015-07-22 NOTE — Progress Notes (Signed)
Weight and vitals stable. Denies pain. Reports ghost like occurences and taste changes are happening less frequently. Recently saw PCP for edema in ankles. Was prescribed compression socks. Also, he was prescribed Solstar to control elevated blood sugar. Reports drowsiness throughout the day. Reports difficulty finding words continues. Reports decadron 4 mg tid. No thrush noted.   BP 136/73 mmHg  Pulse 112  Resp 16  Wt 215 lb (97.523 kg) Wt Readings from Last 3 Encounters:  07/22/15 215 lb (97.523 kg)  07/15/15 220 lb (99.791 kg)  07/13/15 222 lb (100.699 kg)

## 2015-07-22 NOTE — Progress Notes (Signed)
  Radiation Oncology         [phone]   Name: Philip Shields MRN: [id number]   Date: 07/22/2015  DOB: Feb 18, 1933   Weekly Radiation Therapy Management    ICD-9-CM ICD-10-CM   1. Peripheral edema 782.3 R60.9 furosemide (LASIX) 20 MG tablet  2. Malignant neoplasm of the left temporal lobe of brain 191.2 C71.2    Malignant neoplasm of the left temporal lobe of brain  Current Dose: 18 Gy  Planned Dose:  54 Gy  Narrative The patient presents for routine under treatment assessment. He completed 10/30 treatments. Denies pain. Reports seizures and taste changes are happening less frequently. Recently saw PCP for edema in ankles. Was prescribed compression socks. Also, he was prescribed Solstar to control elevated blood sugar. Reports drowsiness throughout the day. Reports difficulty finding words continues. Reports decadron 4 mg tid. The RN did not notice any thrush. Set-up films were reviewed. The chart was checked.   Physical Findings  weight is 215 lb (97.523 kg). His blood pressure is 136/73 and his pulse is 112. His respiration is 16. . The patient's weight is currently essentially stable.There is no significant changes to the status of the paients overall health to be noted at this time. The patient is alert and oriented x3. No thrush was noted upon physical examination.   Impression Philip Shields is a 81 year old gentleman presenting to clinic in regards to his malignant neoplasm of the left temporal lobe of brain. The patient is tolerating radiation. The patient understands the importance of administering his medications and steroids as prescribed.   Plan The patient is advised to continue treatment as planned. A prescription of the medication, Lasix 20 mg, was provided today and sent to the [facility] on [address] (off [address]) of [place], [place]. The patient is instructed to continue the administration of prescribed medications and steroids as appropriate. The patient  is advised of his follow-up appointment with radiation oncology as scheduled.   This document serves as a record of services personally performed by Tyler Pita, MD. It was created on his behalf by Darcus Austin, a trained medical scribe. The creation of this record is based on the scribe's personal observations and the provider's statements to them. This document has been checked and approved by the attending provider.    _________________________________________________   Sheral Apley. Tammi Klippel, M.D.

## 2015-07-25 ENCOUNTER — Ambulatory Visit: Payer: Medicare Other

## 2015-07-25 ENCOUNTER — Ambulatory Visit
Admission: RE | Admit: 2015-07-25 | Discharge: 2015-07-25 | Disposition: A | Discharge status: Home / Self Care | Payer: Medicare Other | Source: Ambulatory Visit | Attending: Radiation Oncology | Admitting: Radiation Oncology

## 2015-07-25 ENCOUNTER — Ambulatory Visit: Payer: Medicare Other | Admitting: Neurology

## 2015-07-25 DIAGNOSIS — Z510 Encounter for antineoplastic radiation therapy: Secondary | ICD-10-CM | POA: Diagnosis not present

## 2015-07-26 ENCOUNTER — Ambulatory Visit
Admission: RE | Admit: 2015-07-26 | Discharge: 2015-07-26 | Disposition: A | Discharge status: Home / Self Care | Payer: Medicare Other | Source: Ambulatory Visit | Attending: Radiation Oncology | Admitting: Radiation Oncology

## 2015-07-26 ENCOUNTER — Telehealth: Payer: Self-pay | Admitting: Radiation Oncology

## 2015-07-26 ENCOUNTER — Ambulatory Visit: Payer: Medicare Other

## 2015-07-26 DIAGNOSIS — Z510 Encounter for antineoplastic radiation therapy: Secondary | ICD-10-CM | POA: Diagnosis not present

## 2015-07-26 NOTE — Telephone Encounter (Signed)
Phoned to inquire about patient's status. No answer on home phone. Left message. Phoned wife's cell. No answer but, no option to leave message. Patient's blood sugar was elevated during PUT on Friday.

## 2015-07-27 ENCOUNTER — Ambulatory Visit: Payer: Medicare Other

## 2015-07-27 ENCOUNTER — Ambulatory Visit
Admission: RE | Admit: 2015-07-27 | Discharge: 2015-07-27 | Disposition: A | Discharge status: Home / Self Care | Payer: Medicare Other | Source: Ambulatory Visit | Attending: Radiation Oncology | Admitting: Radiation Oncology

## 2015-07-27 ENCOUNTER — Ambulatory Visit (INDEPENDENT_AMBULATORY_CARE_PROVIDER_SITE_OTHER): Payer: Medicare Other | Admitting: Family Medicine

## 2015-07-27 ENCOUNTER — Encounter: Payer: Self-pay | Admitting: Family Medicine

## 2015-07-27 VITALS — BP 83/57 | HR 88 | Temp 97.9°F | Resp 16 | Ht 69.0 in | Wt 216.0 lb

## 2015-07-27 DIAGNOSIS — R739 Hyperglycemia, unspecified: Secondary | ICD-10-CM | POA: Diagnosis not present

## 2015-07-27 DIAGNOSIS — R609 Edema, unspecified: Secondary | ICD-10-CM | POA: Diagnosis not present

## 2015-07-27 DIAGNOSIS — Z510 Encounter for antineoplastic radiation therapy: Secondary | ICD-10-CM | POA: Diagnosis not present

## 2015-07-27 DIAGNOSIS — T380X5A Adverse effect of glucocorticoids and synthetic analogues, initial encounter: Principal | ICD-10-CM

## 2015-07-27 DIAGNOSIS — R600 Localized edema: Secondary | ICD-10-CM

## 2015-07-27 DIAGNOSIS — G9389 Other specified disorders of brain: Secondary | ICD-10-CM

## 2015-07-27 DIAGNOSIS — G939 Disorder of brain, unspecified: Secondary | ICD-10-CM | POA: Diagnosis not present

## 2015-07-27 DIAGNOSIS — N184 Chronic kidney disease, stage 4 (severe): Secondary | ICD-10-CM

## 2015-07-27 LAB — COMPREHENSIVE METABOLIC PANEL
ALT: 154 U/L — ABNORMAL HIGH (ref 0–53)
AST: 36 U/L (ref 0–37)
Albumin: 3 g/dL — ABNORMAL LOW (ref 3.5–5.2)
Alkaline Phosphatase: 60 U/L (ref 39–117)
BUN: 84 mg/dL — AB (ref 6–23)
CHLORIDE: 107 meq/L (ref 96–112)
CO2: 22 mEq/L (ref 19–32)
Calcium: 8.5 mg/dL (ref 8.4–10.5)
Creatinine, Ser: 3.56 mg/dL — ABNORMAL HIGH (ref 0.40–1.50)
GFR: 17.65 mL/min — ABNORMAL LOW (ref >60.00)
GLUCOSE: 167 mg/dL — AB (ref 70–99)
POTASSIUM: 4.9 meq/L (ref 3.5–5.1)
SODIUM: 138 meq/L (ref 135–145)
Total Bilirubin: 0.7 mg/dL (ref 0.2–1.2)
Total Protein: 5.1 g/dL — ABNORMAL LOW (ref 6.0–8.3)

## 2015-07-27 LAB — HEMOGLOBIN A1C: Hgb A1c MFr Bld: 9.3 % — ABNORMAL HIGH (ref 4.6–6.5)

## 2015-07-27 MED ORDER — INSULIN GLARGINE 100 UNIT/ML SOLOSTAR PEN: 15.0000 [IU] | PEN_INJECTOR | Freq: Every day | SUBCUTANEOUS | Status: AC

## 2015-07-27 NOTE — Patient Instructions (Signed)
Stop hydralazine and lasix. Monitor blood pressure 1-2 times per day.   Restart hydralazine at previous dosing if blood pressure rises above 150 on top or 95 on bottom.

## 2015-07-27 NOTE — Progress Notes (Signed)
OFFICE VISIT  07/27/2015   CC:  Chief Complaint  Patient presents with  . Follow-up    2 week f/u   HPI:    Patient is a 81 y.o. Caucasian male who presents for 2 week f/u L temporal brain mass--undergoing radiation treatments. Primarily here to f/u bilat LL edema and recent steroid-induced hyperglycemia. Dr. Tammi Klippel started him on lasix 20mg  qod 07/22/15. I started him on 10mg  lantus 07/13/15.  Doing pretty good, just very fatigued/tired. Working on getting compression stockings to work for him.  Getting them on is tough. Taking lasix 20mg  qod since 07/22/15.  No signif change in LE edema since starting this med.  Not checking home glucoses but is taking 10 U lantus nightly. No home bp monitoring.  Past Medical History  Diagnosis Date  . Arthritis     Knees and low back  . Anxiety   . GERD (gastroesophageal reflux disease)   . Hyperlipidemia   . Anemia of chronic renal failure   . Chronic renal insufficiency, stage IV (severe) 2014/15    CrCl in the 20s as of 02/2014.  Kidney dz secondary to HTN, chronic NSAIDs (excessive kidney scarring noted)--Dr. Deterding.  Cr 2.2-2.6 range.  Marland Kitchen BPH with elevated PSA   . Hypertensive chronic kidney disease   . Colon polyp 2005    Not sent to path; repeat recommended 5 yrs, so repeat colonoscopy 03/2010 was normal  . Palpitations     Worked up by Dr. Gwenlyn Found (myoview neg, echo normal, 77mo event monitor unremarkable: no new  meds were recommended.  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 Essential hypertension   . Hard of hearing   . Seasonal allergic rhinitis   . 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 Transthoracic echocardiogram  11/2010    Concentric LVH, diastolic dysfunction.  EF normal.  Mild mitral regurg.  . Acromioplasty  04/03/02    Left, with repair of  complete rotator cuff repair  . Quadriceps tendon repair  2002    Both knees, right most recent  . Lumbar laminectomy  2001    L4  . Knee surgery      Bilateral  . Transthoracic echocardiogram  05/2015    EF 60-65%, mod LVH, normal valves.  . Carotid dopplers  06/14/15    No stenosis  . Rotator cuff repair      Right    Outpatient Prescriptions Prior to Visit  Medication Sig Dispense Refill  . calcitRIOL (ROCALTROL) 0.25 MCG capsule Take 1 capsule by mouth Every morning.    Marland Kitchen dexamethasone (DECADRON) 4 MG tablet Take 1 tablet (4 mg total) by mouth 3 (three) times daily. 80 tablet 5  . fexofenadine (ALLEGRA) 180 MG tablet Take 180 mg by mouth daily as needed for allergies or rhinitis.    . furosemide (LASIX) 20 MG tablet Take 1 tablet (20 mg total) by mouth every other day. 30 tablet 3  . hydrALAZINE (APRESOLINE) 25 MG tablet take 1 tablet by mouth every 6 hours 120 tablet 0  . Insulin Pen Needle 32G X 4 MM MISC 1 each by Does not apply route at bedtime. 100 each 12  . LamoTRIgine (LAMICTAL XR) 25 MG TB24 tablet Take 1 tablet daily for 2 weeks, then increase to 2 tablets daily for 2 weeks, then increase  to 3 tablets daily 90 tablet 4  . levETIRAcetam (KEPPRA) 750 MG tablet take 1 tablet by mouth twice a day 60 tablet 0  . losartan (COZAAR) 50 MG tablet take 1 tablet by mouth once daily 90 tablet 1  . omeprazole (PRILOSEC) 20 MG capsule 20 mg. Take 1 capsule daily  0  . prochlorperazine (COMPAZINE) 10 MG tablet take 1 tablet by mouth every 6 hours if needed for nausea or vomiting  0  . simvastatin (ZOCOR) 40 MG tablet take 1 tablet by mouth at bedtime 90 tablet 1  . tamsulosin (FLOMAX) 0.4 MG CAPS capsule take 1 capsule by mouth at bedtime 90 capsule 1  . temazepam (RESTORIL) 15 MG capsule   0  . valACYclovir (VALTREX) 1000 MG tablet take 2 tablets by mouth IMMEDIATELY AT ONSET OF LESION. REPEAT IN 12 HOURS 30 tablet 1  . Insulin Glargine (LANTUS SOLOSTAR) 100 UNIT/ML Solostar Pen Inject  10 Units into the skin daily at 10 pm. 5 pen PRN  . diclofenac sodium (VOLTAREN) 1 % GEL   0  . ranitidine (ZANTAC) 150 MG tablet Take 150 mg by mouth daily as needed for heartburn.     No facility-administered medications prior to visit.    No Known Allergies  ROS As per HPI  PE: Blood pressure 83/57, pulse 88, temperature 97.9 F (36.6 C), temperature source Oral, resp. rate 16, height 5\' 9"  (1.753 m), weight 216 lb (97.977 kg), SpO2 98 %. Gen: Alert, well appearing.  Patient is oriented to person, place, time, and situation. Some word finding difficulties displayed. CV: RRR, distant S1 and S2, no m/r/g LUNGS: Trace bibasilar early insp crackles, good aeration, remainder of lung fields clear, nonlabored resps EXT: 3 to 4+ pitting edema in feet/ankles, extending up to level of knees.  No weeping, no rash/erythema/tenderness.   LABS:    Chemistry      Component Value Date/Time   NA 136 07/13/2015 1441   K 5.0 07/13/2015 1441   CL 107 07/13/2015 1441   CO2 18* 07/13/2015 1441   BUN 74* 07/13/2015 1441   CREATININE 2.79* 07/13/2015 1441      Component Value Date/Time   CALCIUM 8.4 07/13/2015 1441   ALKPHOS 47 07/13/2015 1441   AST 34 07/13/2015 1441   ALT 114* 07/13/2015 1441   BILITOT 0.5 07/13/2015 1441     Glucose 07/13/15 was 268. Capillary glucose at rad onc 07/22/15 was 309.  IMPRESSION AND PLAN:  1) Steroid-induced hyperglycemia; needs to contact insurer to find out what glucometer/strips I can rx him. Based on ongoing dexamethasone need and cap glucose of 309 recently will increase lantus to 15 U qhs. Check HbA1c baseline today.  When he gets a glucometer we will see what fastings and 2H PP's are running and may have to add mealtime insulin.  2) LE venous insufficiency edema/steroid-related fluid retention: he may be volume depleted from recent lasix use or overmedicated from a standpoint of his antihypertensives: bp low today. Will hold lasix and also hold his  hydralazine for now. Monitor bp at home 1-2 times per day and restart hydralazine at previous dosing IF bp gets above 150 syst or above 95 diastolic. Check CMET today.  3) CRI, stage 4: lytes/cr recheck today.  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 Tammi Klippel.  FOLLOW UP: Return in about 2 weeks (around 08/10/2015) for f/u hyperglycemia and edema.

## 2015-07-27 NOTE — Progress Notes (Signed)
Pre visit review using our clinic review tool, if applicable. No additional management support is needed unless otherwise documented below in the visit note. 

## 2015-07-28 ENCOUNTER — Ambulatory Visit: Payer: Medicare Other

## 2015-07-28 ENCOUNTER — Ambulatory Visit
Admission: RE | Admit: 2015-07-28 | Discharge: 2015-07-28 | Disposition: A | Discharge status: Home / Self Care | Payer: Medicare Other | Source: Ambulatory Visit | Attending: Radiation Oncology | Admitting: Radiation Oncology

## 2015-07-28 ENCOUNTER — Encounter: Payer: Self-pay | Admitting: *Deleted

## 2015-07-28 DIAGNOSIS — Z510 Encounter for antineoplastic radiation therapy: Secondary | ICD-10-CM | POA: Diagnosis not present

## 2015-07-29 ENCOUNTER — Ambulatory Visit
Admission: RE | Admit: 2015-07-29 | Discharge: 2015-07-29 | Disposition: A | Discharge status: Home / Self Care | Payer: Medicare Other | Source: Ambulatory Visit | Attending: Radiation Oncology | Admitting: Radiation Oncology

## 2015-07-29 ENCOUNTER — Ambulatory Visit: Payer: Medicare Other

## 2015-07-29 ENCOUNTER — Encounter: Payer: Self-pay | Admitting: Radiation Oncology

## 2015-07-29 VITALS — BP 140/90 | HR 88 | Resp 18 | Wt 214.5 lb

## 2015-07-29 DIAGNOSIS — Z510 Encounter for antineoplastic radiation therapy: Secondary | ICD-10-CM | POA: Diagnosis not present

## 2015-07-29 DIAGNOSIS — C712 Malignant neoplasm of temporal lobe: Secondary | ICD-10-CM

## 2015-07-29 LAB — GLUCOSE, CAPILLARY: GLUCOSE-CAPILLARY: 349 mg/dL — AB (ref 65–99)

## 2015-07-29 NOTE — Progress Notes (Signed)
  Radiation Oncology         [phone]   Name: Philip Shields MRN: [id number]   Date: 07/29/2015  DOB: [birth year]   Weekly Radiation Therapy Management    ICD-9-CM ICD-10-CM   1. 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 He has completed 15 out of 30 treatments.   He denies symptoms of pain, headache, dizziness, vomiting, diplopia or ringing in the ears. The patient confirms that current medications include decadron 4 mg tid as his PCP took him off of Lasix and two blood pressure pills. Additionally, he confirms symptoms of occasional nausea. He was unable to apply compression stockings and edema in ankles has returned. His blood sugar is currently 349. His hair is currently thinning  The patient states that "ghost like occurrences and taste changes rarely happen now." Set-up films were reviewed. The chart was checked. The patient projected a healthy mental status and was accompanied by his wife.    Physical Findings  weight is 214 lb 8 oz (97.297 kg). His blood pressure is 140/90 and his pulse is 88. His respiration is 18 and oxygen saturation is 100%.  The patient's weight is currently essentially stable. There is no significant changes to the status of overall health to be noted at this time. The patient is alert and oriented x3. No thrush was noted upon physical examination.   Impression Philip Shields is a 81 year old gentleman presenting to clinic in regards to his malignant neoplasm of the left temporal lobe of brain. The patient is tolerating radiation.   He understands the importance of communicating all symptoms of concern not only to Dr. Tammi Klippel, MD but his PCP. He understands that hair loss may occur in future treatments. The patient understands the importance of administering his  medications and steroids strictly as prescribed. He understands that steroids can effect blood pressure. The patient understands that he can access his appointments and medical records via [place].    Plan The patient is advised to continue treatment as planned. Healthy methods of management in regards to reported symptoms were reviewed in detail. He is to monitor his blood pressure and blood sugar. His medication will be weaned from three times daily to only two times daily. The patient is advised of his follow-up appointment with radiation oncology to take place next week, as scheduled.   All vocalized questions and concerns have been addressed. If the patient develops any further questions or concerns in regards to his treatment and recovery, he has been encouraged to contact Dr. Tammi Klippel, MD.     This document serves as a record of services personally performed by Tyler Pita, MD. It was created on his behalf by Lenn Cal, a trained medical scribe. The creation of this record is based on the scribe's personal observations and the Yaacov Koziol's statements to them. This document has been checked and approved by the attending Samantha Ragen. _________________________________________________   Sheral Apley. Tammi Klippel, M.D.

## 2015-07-29 NOTE — Progress Notes (Signed)
Weight and vitals stable. Denies pain. Reports ghost like occurrences and taste changes rarely happen now. Edema in ankles has returned. Wife reports PCP took the patient off lasix and two blood pressure pills. Patient unable to apply compression stockings. Encouraged wife to bring stockings next appointment and this RN would be glad to assist. Denies headache, dizziness, vomiting, diplopia or ringing in the ears. Repots occasional nausea. Blood sugar today is 349.        Reports using Biafine cream as directed. No skin changes noted within treatment field. Difficulty findings words continues. Reports taking decadron 4 mg tid. No thrush noted.   BP 140/90 mmHg  Pulse 88  Resp 18  Wt 214 lb 8 oz (97.297 kg)  SpO2 100% Wt Readings from Last 3 Encounters:  07/29/15 214 lb 8 oz (97.297 kg)  07/27/15 216 lb (97.977 kg)  07/22/15 215 lb (97.523 kg)

## 2015-08-01 ENCOUNTER — Ambulatory Visit: Payer: Medicare Other

## 2015-08-01 ENCOUNTER — Ambulatory Visit
Admission: RE | Admit: 2015-08-01 | Discharge: 2015-08-01 | Disposition: A | Discharge status: Home / Self Care | Payer: Medicare Other | Source: Ambulatory Visit | Attending: Radiation Oncology | Admitting: Radiation Oncology

## 2015-08-01 DIAGNOSIS — Z510 Encounter for antineoplastic radiation therapy: Secondary | ICD-10-CM | POA: Diagnosis not present

## 2015-08-02 ENCOUNTER — Telehealth: Payer: Self-pay | Admitting: Radiation Oncology

## 2015-08-02 ENCOUNTER — Ambulatory Visit
Admission: RE | Admit: 2015-08-02 | Discharge: 2015-08-02 | Disposition: A | Discharge status: Home / Self Care | Payer: Medicare Other | Source: Ambulatory Visit | Attending: Radiation Oncology | Admitting: Radiation Oncology

## 2015-08-02 ENCOUNTER — Ambulatory Visit: Payer: Medicare Other

## 2015-08-02 DIAGNOSIS — Z510 Encounter for antineoplastic radiation therapy: Secondary | ICD-10-CM | POA: Diagnosis not present

## 2015-08-02 NOTE — Telephone Encounter (Signed)
Returned message left by patient's wife. She request this RN teach her how to use their new glucose monitor. Encouraged her to present to nursing following her husbands treatment appointment for glucose monitor education.

## 2015-08-03 ENCOUNTER — Ambulatory Visit
Admission: RE | Admit: 2015-08-03 | Discharge: 2015-08-03 | Disposition: A | Discharge status: Home / Self Care | Payer: Medicare Other | Source: Ambulatory Visit | Attending: Radiation Oncology | Admitting: Radiation Oncology

## 2015-08-03 ENCOUNTER — Ambulatory Visit: Payer: Medicare Other

## 2015-08-03 DIAGNOSIS — Z510 Encounter for antineoplastic radiation therapy: Secondary | ICD-10-CM | POA: Diagnosis not present

## 2015-08-04 ENCOUNTER — Ambulatory Visit: Payer: Medicare Other

## 2015-08-04 ENCOUNTER — Ambulatory Visit
Admission: RE | Admit: 2015-08-04 | Discharge: 2015-08-04 | Disposition: A | Discharge status: Home / Self Care | Payer: Medicare Other | Source: Ambulatory Visit | Attending: Radiation Oncology | Admitting: Radiation Oncology

## 2015-08-04 DIAGNOSIS — Z510 Encounter for antineoplastic radiation therapy: Secondary | ICD-10-CM | POA: Diagnosis not present

## 2015-08-05 ENCOUNTER — Ambulatory Visit: Payer: Medicare Other

## 2015-08-05 ENCOUNTER — Ambulatory Visit
Admission: RE | Admit: 2015-08-05 | Discharge: 2015-08-05 | Disposition: A | Discharge status: Home / Self Care | Payer: Medicare Other | Source: Ambulatory Visit | Attending: Radiation Oncology | Admitting: Radiation Oncology

## 2015-08-05 ENCOUNTER — Encounter: Payer: Self-pay | Admitting: Radiation Oncology

## 2015-08-05 VITALS — BP 122/70 | HR 88 | Temp 98.1°F | Resp 16 | Ht 69.0 in | Wt 216.4 lb

## 2015-08-05 DIAGNOSIS — Z510 Encounter for antineoplastic radiation therapy: Secondary | ICD-10-CM | POA: Diagnosis not present

## 2015-08-05 DIAGNOSIS — C712 Malignant neoplasm of temporal lobe: Secondary | ICD-10-CM

## 2015-08-05 NOTE — Progress Notes (Signed)
  Radiation Oncology         [phone]   Name: [name] MRN: [id number]   Date: 08/05/2015  DOB: 1933/08/04   Weekly Radiation Therapy Management    ICD-9-CM ICD-10-CM   1. 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 He has completed 20 out of 30 treatments.   He denies pian, headaches, vision changes, nausea and dizziness. He is taking decadron 4 mg three times a day. He reports seeing "a ghost woman" once a night and goes away after a minute. His wife reports he is taking Lamictal and Keppra to help with this. He reports that his hair has been falling out. His skin is intact on his forehead and scalp. He is using biafine. His wife reports he is very fatigued. The patient reports that his short-term memory is poor.  Set-up films were reviewed. The chart was checked.   Physical Findings  height is 5\' 9"  (1.753 m) and weight is 216 lb 6.4 oz (98.158 kg). His oral temperature is 98.1 F (36.7 C). His blood pressure is 122/70 and his pulse is 88. His respiration is 16 and oxygen saturation is 99%.  The patient's weight is currently stable. There is no significant changes to the status of overall health to be noted at this time. The patient is alert and oriented x3. Ambulatory with a cane.   Impression Philip Shields is a 81 year old gentleman presenting to clinic in regards to his malignant neoplasm of the left temporal lobe of the brain. The patient is tolerating radiation.    Plan The patient is advised to continue treatment as planned. Healthy methods of management in regards to reported symptoms were reviewed in detail. He is to monitor his blood pressure and blood sugar. The patient is advised of his follow-up appointment with radiation oncology to take place next week, as scheduled.  He is to  keep his decadron usage steady with 4mg  TID.  All vocalized questions and concerns have been addressed. If the patient develops any further questions or concerns in regards to his treatment and recovery, he has been encouraged to contact Dr. Tammi Klippel, MD.     This document serves as a record of services personally performed by Tyler Pita, MD. It was created on his behalf by Darcus Austin, a trained medical scribe. The creation of this record is based on the scribe's personal observations and the provider's statements to them. This document has been checked and approved by the attending provider.     _________________________________________________   Sheral Apley. Tammi Klippel, M.D.

## 2015-08-05 NOTE — Progress Notes (Addendum)
Philip Shields has completed 20 fractions to his brain.  He denies pian, headaches, vision changes, nausea and dizziness. He is using a cane for his balance and says walking is easier when he has been moving.  He is taking decadron 4 mg three times a day.  He reports seeing "a phantom man" once a night and goes away after a minute.  His wife reports he is taking Lamictal and Keppra to help with this.  He reports that his hair has been falling out.  His skin is intact on his forehead and scalp.  He is using biafine.  His wife reports he is fatigued.  BP 122/70 mmHg  Pulse 88  Temp(Src) 98.1 F (36.7 C) (Oral)  Resp 16  Ht 5\' 9"  (1.753 m)  Wt 216 lb 6.4 oz (98.158 kg)  BMI 31.94 kg/m2  SpO2 99%   Wt Readings from Last 3 Encounters:  08/05/15 216 lb 6.4 oz (98.158 kg)  07/29/15 214 lb 8 oz (97.297 kg)  07/27/15 216 lb (97.977 kg)

## 2015-08-08 ENCOUNTER — Ambulatory Visit
Admission: RE | Admit: 2015-08-08 | Discharge: 2015-08-08 | Disposition: A | Discharge status: Home / Self Care | Payer: Medicare Other | Source: Ambulatory Visit | Attending: Radiation Oncology | Admitting: Radiation Oncology

## 2015-08-08 ENCOUNTER — Other Ambulatory Visit: Payer: Self-pay | Admitting: *Deleted

## 2015-08-08 ENCOUNTER — Telehealth: Payer: Self-pay | Admitting: Radiation Oncology

## 2015-08-08 DIAGNOSIS — Z510 Encounter for antineoplastic radiation therapy: Secondary | ICD-10-CM | POA: Diagnosis not present

## 2015-08-08 LAB — GLUCOSE, CAPILLARY: Glucose-Capillary: 482 mg/dL — ABNORMAL HIGH (ref 65–99)

## 2015-08-08 MED ORDER — TAMSULOSIN HCL 0.4 MG PO CAPS: ORAL_CAPSULE | ORAL | Status: AC

## 2015-08-08 NOTE — Telephone Encounter (Signed)
RF request for tamsulosin LOV: 07/27/15 Next ov: 11/22/15 Last written: 11/10/14 #90 w/ 1RF

## 2015-08-08 NOTE — Telephone Encounter (Signed)
Patient's wife phoned and left a message requesting a return call. Phoned wife on her mobile and home numbers. No answer. No option to leave a message. Requested Heather, RT for L1 send patient around following treatment. Suspect patient may have thrush per message left by wife.

## 2015-08-08 NOTE — Progress Notes (Signed)
Radiation treatment machine behind schedule thus patient was sent around for nurse evaluation. No signs of thrush noted. Patient denies difficulty swallowing. Large fever blister noted upper lip. Patient has script for Valtrex already. Provided patient with Aquaphor to apply to upper lip. Blood sugar proved to be 482 today. Advised wife to contact PCP/prescriber of Solastar asap for direction. Also, encouraged the patient to avoid sugary foods. Both verbalized understanding. Directed wife and patient of use of home bp and glucose machine they brought in.

## 2015-08-09 ENCOUNTER — Telehealth: Payer: Self-pay | Admitting: *Deleted

## 2015-08-09 ENCOUNTER — Ambulatory Visit
Admission: RE | Admit: 2015-08-09 | Discharge: 2015-08-09 | Disposition: A | Discharge status: Home / Self Care | Payer: Medicare Other | Source: Ambulatory Visit | Attending: Radiation Oncology | Admitting: Radiation Oncology

## 2015-08-09 DIAGNOSIS — Z510 Encounter for antineoplastic radiation therapy: Secondary | ICD-10-CM | POA: Diagnosis not present

## 2015-08-09 NOTE — Telephone Encounter (Signed)
Pts wife called and stated that the home health nurse came out yesterday to help them with checking his BS. She stated that the nurse checked pts BS and it was 482. She stated that it had been 3 hours since pt had eaten. She wants to know if pt needs to increase his insulin. She stated that he has his blood sugar is checked once a week by the nurse (they are having trouble checking it at home) and that the past two readings have been in the 500's and in 300's. She stated that he is currently taking 15 units of insulin at bedtime. Please advise. Thanks.

## 2015-08-10 ENCOUNTER — Other Ambulatory Visit: Payer: Self-pay | Admitting: Radiation Oncology

## 2015-08-10 ENCOUNTER — Ambulatory Visit
Admission: RE | Admit: 2015-08-10 | Discharge: 2015-08-10 | Disposition: A | Discharge status: Home / Self Care | Payer: Medicare Other | Source: Ambulatory Visit | Attending: Radiation Oncology | Admitting: Radiation Oncology

## 2015-08-10 DIAGNOSIS — C712 Malignant neoplasm of temporal lobe: Secondary | ICD-10-CM

## 2015-08-10 DIAGNOSIS — Z510 Encounter for antineoplastic radiation therapy: Secondary | ICD-10-CM | POA: Diagnosis not present

## 2015-08-10 LAB — GLUCOSE, CAPILLARY: GLUCOSE-CAPILLARY: 359 mg/dL — AB (ref 65–99)

## 2015-08-10 NOTE — Progress Notes (Signed)
Patient presented to the clinic today following treatment accompanied by his wife. Heart rate elevated. Blood sugar 359. Wife reports patient continues decadron tid. Wife concerned that patient is no longer able to ambulate up stairs and forgot how to shower today. No thrush noted. Denies headache, dizziness, nausea, vomiting, diplopia or ringing in the ears. Reviewed symptoms with Dr. Lisbeth Renshaw and determined no emergent needs. Triggered [facility] to evaluate for needs. Dr. Tammi Klippel will evaluate patient tomorrow following treatment.

## 2015-08-10 NOTE — Telephone Encounter (Signed)
Left message for pt to call back  °

## 2015-08-10 NOTE — Telephone Encounter (Signed)
Yes, increase insulin to 25 U at bedtime.

## 2015-08-11 ENCOUNTER — Ambulatory Visit
Admission: RE | Admit: 2015-08-11 | Discharge: 2015-08-11 | Disposition: A | Discharge status: Home / Self Care | Payer: Medicare Other | Source: Ambulatory Visit | Attending: Radiation Oncology | Admitting: Radiation Oncology

## 2015-08-11 ENCOUNTER — Telehealth: Payer: Self-pay | Admitting: *Deleted

## 2015-08-11 ENCOUNTER — Encounter: Payer: Self-pay | Admitting: Radiation Oncology

## 2015-08-11 VITALS — BP 106/53 | HR 122 | Temp 98.4°F | Resp 18

## 2015-08-11 DIAGNOSIS — C712 Malignant neoplasm of temporal lobe: Secondary | ICD-10-CM

## 2015-08-11 DIAGNOSIS — Z510 Encounter for antineoplastic radiation therapy: Secondary | ICD-10-CM | POA: Diagnosis not present

## 2015-08-11 NOTE — Telephone Encounter (Signed)
XXXX 

## 2015-08-11 NOTE — Progress Notes (Signed)
CC: Dr. Tyler Pita  Weekly Management Note:  Site: Left brain Current Dose:  4320  cGy Projected Dose: 5400  cGy  Narrative: The patient is seen today for routine under treatment assessment. CBCT/MVCT images/port films were reviewed. The chart was reviewed.  His cone-beam CT scan today shows what appears to be a fair amount of edema along the left brain (as best I can tell) but really no change in his slight midline shift or compression of the left ventricle.  He has been slowly deteriorating this week.  He was hyperglycemic with a blood sugar 359 yesterday and Dr. Melford Aase, his primary care physician, increased his insulin.  He is currently taking 4 mg of dexamethasone 3 times a day.  He is eating very little.  He remains emotionally labile.  He wonders if he is going to die in the near future.  He has moved to his downstairs bedroom, having difficulty with his leg strength.  Physical Examination:  Filed Vitals:   08/11/15 1629  BP: 106/53  Pulse: 122  Temp: 98.4 F (36.9 C)  Resp: 18  .  There is treatment-related alopecia.  He does have a fair amount of ankle edema.  Impression: Philip Shields is having a difficult time in his deterioration is probably multifactorial.  He understands that his steroids are a two- edged sword and that we need steroids to control his brain edema, but there also associated side effects from steroids.  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  Radiation therapy itself can cause fatigue.  From a metabolic standpoint his hyperglycemia appears to be improving with an increase in his insulin.  I think he should probably take tomorrow off and have a long weekend and then see Dr. Tammi Klippel again on Monday.  They would like to speak with Dr. Tammi Klippel this evening.  Philip Bar RN is making arrangements for him to see palliative care on Monday.  Dr. Tammi Klippel will decide whether not to continue  with radiation therapy for a remaining 6 fractions.  I suspect that some of his symptomatology may be reversible.   Plan: Dr. Tammi Klippel to call the patient's wife Philip Shields) this evening and let them know if he should come in for his treatment tomorrow or whether not to have a 3 day rest.  Dr. Tammi Klippel will decide whether not to proceed with the remainder of his radiation therapy depending on his progress.

## 2015-08-11 NOTE — Progress Notes (Addendum)
Heart rate elevated. Blood sugar 256 down from 359 yesterday. Dr. Anitra Lauth, PCP, increased insulin from 15 units to 25 units. Reports taking decadron 4 mg tid. No thrush noted. Fever blister on upper lip is healing. Using biotene rinse. Wife reports the patient is eating almost nothing. Patient reports foods have no taste. Reports he has no energy and feels weak. Wife reports he sleeps most of the day. Patient tearful today. States, he needs to know if he is going to die or improved. Wife confirms he has been very emotional. Reports intermittent episodes of confusion. Wife reports his cognition is normal. Wife reports she and her son move their bedroom downstairs since the patient is no longer to ambulate upstairs. Denies nausea, vomiting, diplopia, ringing in the ears or headache.  BP 106/53 mmHg  Pulse 122  Temp(Src) 98.4 F (36.9 C) (Oral)  Resp 18  SpO2 100% Wt Readings from Last 3 Encounters:  08/05/15 216 lb 6.4 oz (98.158 kg)  07/29/15 214 lb 8 oz (97.297 kg)  07/27/15 216 lb (97.977 kg)

## 2015-08-11 NOTE — Telephone Encounter (Signed)
Pts wife advised and voiced understanding.  

## 2015-08-12 ENCOUNTER — Encounter: Payer: Self-pay | Admitting: Radiation Oncology

## 2015-08-12 ENCOUNTER — Ambulatory Visit
Admission: RE | Admit: 2015-08-12 | Discharge: 2015-08-12 | Disposition: A | Discharge status: Home / Self Care | Payer: Medicare Other | Source: Ambulatory Visit | Attending: Radiation Oncology | Admitting: Radiation Oncology

## 2015-08-12 ENCOUNTER — Ambulatory Visit: Payer: Medicare Other

## 2015-08-15 ENCOUNTER — Encounter: Payer: Self-pay | Admitting: Radiation Oncology

## 2015-08-15 ENCOUNTER — Telehealth: Payer: Self-pay | Admitting: Radiation Oncology

## 2015-08-15 ENCOUNTER — Ambulatory Visit
Admission: RE | Admit: 2015-08-15 | Discharge: 2015-08-15 | Disposition: A | Discharge status: Home / Self Care | Payer: Medicare Other | Source: Ambulatory Visit | Attending: Radiation Oncology | Admitting: Radiation Oncology

## 2015-08-15 ENCOUNTER — Ambulatory Visit
Admission: RE | Admit: 2015-08-15 | Discharge: 2015-08-15 | Disposition: A | Discharge status: Home / Self Care | Payer: Medicare Other | Source: Ambulatory Visit | Attending: Family Medicine | Admitting: Family Medicine

## 2015-08-15 DIAGNOSIS — Z510 Encounter for antineoplastic radiation therapy: Secondary | ICD-10-CM | POA: Diagnosis not present

## 2015-08-15 DIAGNOSIS — R530 Neoplastic (malignant) related fatigue: Secondary | ICD-10-CM | POA: Diagnosis not present

## 2015-08-15 DIAGNOSIS — D496 Neoplasm of unspecified behavior of brain: Secondary | ICD-10-CM

## 2015-08-15 DIAGNOSIS — Z515 Encounter for palliative care: Secondary | ICD-10-CM

## 2015-08-15 NOTE — Telephone Encounter (Signed)
Received email from patient's wife. Phoned her at home. She reports that [place] slept most of the weekend. She expresses her intention to bring him for treatment today then, to see [name]. Questions if niece can come along and if son can listen in via phone to conversation with [name]. Confirmed that would be fine. Phoned Lurlean Leyden of [place] requesting home health reach out asap.

## 2015-08-15 NOTE — Consult Note (Signed)
Patient Philip Shields      DOB: 08/14/1933      HDQ:[id number]     Consult Note from the Palliative Medicine Team at [facility] Requested by:     PCP: Tammi Sou, MD Reason for Consultation              Phone                                                                                                                                [phone]  Assessment of patients Current state:   Consult is for introduction to the concept of Palliative Medicine, clarification of Advanced Directives,   holistic support and symptom management as indicated  This NP [name] reviewed medical records, received report from team, assessed the patient and then meet with  Philip Shields and his wife Burnette Sautter and niece/Susan and his son by telephone conference call in the OP radiation-oncology clinic.     Discussion regarding the emotional and physical difficulty of living with life limiting illness for both patient and family.  The emotional drain of wondering "what is the right thing to do", "is this radiation really helping".  Questions and concerns addressed regarding the side effects of radiation and recommended interventions                          A  discussion was had today regarding advanced directives.  Concepts specific to code status, artifical feeding and hydration, continued IV antibiotics and rehospitalization was had.  The difference between a aggressive medical intervention path  and a palliative comfort care path for this patient at this time was had.  Values and goals of care important to patient and family were attempted to be elicited.  Concept of Hospice and Palliative Care were discussed  Natural trajectory and expectations at EOL were discussed.  Questions and concerns addressed.  Family encouraged to call with questions or concerns.  PMT will continue to support holistically.   Goals of Care: 1.  Code Status: DNR/DNI   2. Scope of Treatment:  At this  time patient and family are  hopeful for prolong quality life.  They are hopeful to complete radiation treatment plan.  They are very realistic in the  understanding of long term poor  prognosis and main focus is comfort, quality and dignity.   4. Symptom Management:   Fatigue:-Pace yourself -Plan your day -Include naps and breaks -fuel the body -consider complementary therapies  -deep breathing  -prayer/medication  Stressed the Importance to consider taking a day off from the radiation treatment if the patietn cannot get to the hospital withuot   Heavy symptom burden.     5. Psychosocial:  Emotional support offered to patient and his family.  Provided space for all to share their thoughts and feelings in dealing with this difficult situation.  Concepts of the  limitations of medical interventions (risk vs benefit) and mortality were discussed.  6. Spiritual:  Strong community church support     Brief HPI:  Philip Shields is a 81 y.o. male  with newly diagnosed per MRI 8/18 revealed a mass in the left temporal lobe with a lot of swelling and edema in the left temporal lobe. This swelling has caused a 6 mm mid line shift. A CT scan of abdomen and pelvis was performed on 8/19, which showed no obvious evidence of cancer, however there were a few liver cysts that may need attention or close monitoring. Not a candidate for biopsy or resection.  Currently under care Dr Tammi Klippel for palliative radiation    ROS: unable to illicit due to [place]   PMH:  Past Medical History  Diagnosis Date  . Arthritis     Knees and low back  . Anxiety   . GERD (gastroesophageal reflux disease)   . Hyperlipidemia   . Anemia of chronic renal failure   . Chronic renal insufficiency, stage IV (severe) 2014/15    CrCl in the 20s as of 02/2014.  Kidney dz secondary to HTN, chronic NSAIDs (excessive kidney scarring noted)--Dr. Deterding.  Cr 2.2-2.6 range.  Marland Kitchen BPH with elevated PSA   . Hypertensive chronic  kidney disease   . Colon polyp 2005    Not sent to path; repeat recommended 5 yrs, so repeat colonoscopy 03/2010 was normal  . Palpitations     Worked up by Dr. Gwenlyn Found (myoview neg, echo normal, 48mo event monitor unremarkable: no new  meds were recommended.  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 Essential hypertension   . Hard of hearing   . Seasonal allergic rhinitis   . 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 Steroid-induced hyperglycemia 06/2015    A1c 9.3%  . Bilateral lower extremity edema 06/2015    20mg  lasix qod x 3 doses caused bump in Cr of 0.8 (2.8 to 3.6)     PSH: Past Surgical History  Procedure Laterality Date  . Transthoracic echocardiogram  11/2010    Concentric LVH, diastolic dysfunction.  EF normal.  Mild mitral regurg.  . Acromioplasty  04/03/02    Left, with repair of complete rotator cuff repair  . Quadriceps tendon repair  2002    Both knees, right most recent  . Lumbar laminectomy  2001    L4  . Knee surgery      Bilateral  . Transthoracic echocardiogram  05/2015    EF 60-65%, mod LVH, normal valves.  . Carotid dopplers  06/14/15    No stenosis  . Rotator cuff repair      Right   I have reviewed the FH and SH and  If appropriate update it with new information. No Known Allergies Scheduled Meds: Continuous Infusions: PRN Meds:.    There were no vitals taken for this visit.   PPS: 30 % at best  No intake or output data in the 24 hours ending 08/15/15 1452   Physical Exam:  General: chronically ill appearing, weak, sitting in wheelchair, HEENT:  Moist buccal membranes, no exudate Ext: BLE trace edema Neuro: lethargic, moves all four extremities  Labs: CBC    Component Value Date/Time   WBC 11.7* 06/17/2015 0510   RBC 3.65* 06/17/2015 0510   HGB 11.1* 06/17/2015 0510   HGB 10.6 07/04/2011  1417   HCT 32.7*  06/17/2015 0510   PLT 199 06/17/2015 0510   MCV 89.6 06/17/2015 0510   MCH 30.4 06/17/2015 0510   MCHC 33.9 06/17/2015 0510   RDW 12.7 06/17/2015 0510   LYMPHSABS 1.7 04/17/2011 0917   MONOABS 0.5 04/17/2011 0917   EOSABS 0.2 04/17/2011 0917   BASOSABS 0.0 04/17/2011 0917    BMET    Component Value Date/Time   NA 138 07/27/2015 1114   K 4.9 07/27/2015 1114   CL 107 07/27/2015 1114   CO2 22 07/27/2015 1114   GLUCOSE 167* 07/27/2015 1114   GLUCOSE 100* 10/02/2006 1209   BUN 84* 07/27/2015 1114   CREATININE 3.56* 07/27/2015 1114   CALCIUM 8.5 07/27/2015 1114   GFRNONAA 23* 06/17/2015 0510   GFRAA 27* 06/17/2015 0510    CMP     Component Value Date/Time   NA 138 07/27/2015 1114   K 4.9 07/27/2015 1114   CL 107 07/27/2015 1114   CO2 22 07/27/2015 1114   GLUCOSE 167* 07/27/2015 1114   GLUCOSE 100* 10/02/2006 1209   BUN 84* 07/27/2015 1114   CREATININE 3.56* 07/27/2015 1114   CALCIUM 8.5 07/27/2015 1114   PROT 5.1* 07/27/2015 1114   ALBUMIN 3.0* 07/27/2015 1114   AST 36 07/27/2015 1114   ALT 154* 07/27/2015 1114   ALKPHOS 60 07/27/2015 1114   BILITOT 0.7 07/27/2015 1114   GFRNONAA 23* 06/17/2015 0510   GFRAA 27* 06/17/2015 0510   ECOG PERFORMANCE STATUS* (Eastern Cooperative Oncology Group)  0 Fully active, able to continue with all pre-disease activities without restriction. Pt score  1 Restricted in physically strenuous activity but ambulatory and able to carry out work of a light or sedentary nature, e.g., light house work, office work.   2 Ambulatory and capable of all self-care but unable to carry out any work activities. Up and about more than 50% of waking hours.    3 Capable of only limited self-care. Confined to bed or chair more than 50% of waking hours. 3  4 Completely disabled. Cannot carry on any self-care. Totally confined to bed or chair.   5 Dead.    As published in Am. J. Clin. Oncol.: Eustace Pen, M.M., Colon Flattery., [place], D.C., [place]orton, Sharen Hint.,  Drexel Iha, P.P.: Toxicity And Response Criteria Of The [facility]. [place] 4:193-790, 1982.  The ECOG Performance Status is in the public domain therefore available for public use. To duplicate the scale, please cite the reference above and credit the [facility], Tyler Pita M.D., Group Chair    Time In Time Out Total Time Spent with Patient Total Overall Time  1515 1715 120 min 120 min    Greater than 50%  of this time was spent counseling and coordinating care related to the above assessment and plan.   [name] NP  Palliative Medicine Team Team Phone # [phone] Pager [phone]  Discussed with Dr Tammi Klippel

## 2015-08-16 ENCOUNTER — Ambulatory Visit
Admission: RE | Admit: 2015-08-16 | Discharge: 2015-08-16 | Disposition: A | Discharge status: Home / Self Care | Payer: Medicare Other | Source: Ambulatory Visit | Attending: Radiation Oncology | Admitting: Radiation Oncology

## 2015-08-16 ENCOUNTER — Ambulatory Visit: Payer: Medicare Other

## 2015-08-17 ENCOUNTER — Other Ambulatory Visit: Payer: Self-pay | Admitting: Radiation Oncology

## 2015-08-17 ENCOUNTER — Other Ambulatory Visit: Payer: Self-pay | Admitting: *Deleted

## 2015-08-17 ENCOUNTER — Ambulatory Visit: Payer: Medicare Other

## 2015-08-17 ENCOUNTER — Ambulatory Visit
Admission: RE | Admit: 2015-08-17 | Discharge: 2015-08-17 | Disposition: A | Discharge status: Home / Self Care | Payer: Medicare Other | Source: Ambulatory Visit | Attending: Radiation Oncology | Admitting: Radiation Oncology

## 2015-08-17 ENCOUNTER — Telehealth: Payer: Self-pay | Admitting: Radiation Oncology

## 2015-08-17 ENCOUNTER — Other Ambulatory Visit: Payer: Self-pay | Admitting: Family Medicine

## 2015-08-17 MED ORDER — VALACYCLOVIR HCL 1 G PO TABS: ORAL_TABLET | ORAL | Status: AC

## 2015-08-17 NOTE — Telephone Encounter (Signed)
Wife phoned to report her husband wouldn't be able to make it for treatment tomorrow. Informed Emily, RT on L1 of this findings. Treatment cancelled for today.

## 2015-08-17 NOTE — Telephone Encounter (Addendum)
Pts wife LMOM on 08/17/15 at 9:11am.   Pts wife left message stating that he has a fever blister on his lip and would like a refill for Valtrex. Please advise. Thanks.

## 2015-08-17 NOTE — Telephone Encounter (Signed)
Faxed over order for standard wheel chair, 3 in 1 bedside commode, front wheel walker, and wheel chair ramp to Vonna Drafts, PT for [facility]. Fax confirmation of delivery obtained.

## 2015-08-18 ENCOUNTER — Ambulatory Visit: Payer: Medicare Other

## 2015-08-18 ENCOUNTER — Ambulatory Visit
Admission: RE | Admit: 2015-08-18 | Discharge: 2015-08-18 | Disposition: A | Discharge status: Home / Self Care | Payer: Medicare Other | Source: Ambulatory Visit | Attending: Radiation Oncology | Admitting: Radiation Oncology

## 2015-08-19 ENCOUNTER — Ambulatory Visit: Payer: Medicare Other

## 2015-08-19 ENCOUNTER — Telehealth: Payer: Self-pay | Admitting: Radiation Oncology

## 2015-08-19 NOTE — Telephone Encounter (Signed)
Faxed over complete and signed PROGRESS NOTE FOR THE EVALUATION OF NEED FOR A WHEELCHAIR to Vonna Drafts at [phone]. Fax confirmation of delivery obtained.

## 2015-08-22 ENCOUNTER — Ambulatory Visit: Payer: Medicare Other

## 2015-08-22 ENCOUNTER — Ambulatory Visit: Admission: RE | Admit: 2015-08-22 | Payer: Medicare Other | Source: Ambulatory Visit

## 2015-08-22 ENCOUNTER — Telehealth: Payer: Self-pay | Admitting: Family Medicine

## 2015-08-22 DIAGNOSIS — D496 Neoplasm of unspecified behavior of brain: Secondary | ICD-10-CM | POA: Insufficient documentation

## 2015-08-22 DIAGNOSIS — Z515 Encounter for palliative care: Secondary | ICD-10-CM | POA: Insufficient documentation

## 2015-08-22 DIAGNOSIS — R530 Neoplastic (malignant) related fatigue: Secondary | ICD-10-CM | POA: Insufficient documentation

## 2015-08-22 NOTE — Telephone Encounter (Signed)
Please advise. Thanks.  

## 2015-08-22 NOTE — Telephone Encounter (Signed)
Pt is requesting a rx for Drequan to get a hospital bed in the home. As well as a hoyer lift. Please fax to [facility] at [phone]

## 2015-08-23 ENCOUNTER — Ambulatory Visit: Payer: Medicare Other

## 2015-08-23 ENCOUNTER — Telehealth: Payer: Self-pay | Admitting: *Deleted

## 2015-08-23 ENCOUNTER — Encounter: Payer: Self-pay | Admitting: Family Medicine

## 2015-08-23 ENCOUNTER — Encounter: Payer: Self-pay | Admitting: Internal Medicine

## 2015-08-23 ENCOUNTER — Telehealth: Payer: Self-pay | Admitting: Radiation Oncology

## 2015-08-23 NOTE — Telephone Encounter (Signed)
Thanks Sam,  I agree with discontinuing radiation and involving hospice.  Do we know his current steroid dose?  If low, an increase in that may be helpful.  MM

## 2015-08-23 NOTE — Progress Notes (Deleted)
bnbnbn

## 2015-08-23 NOTE — Progress Notes (Signed)
   Philip Shields called this morning upset with recent events.  Philip Shields continues to decline physically, functionally and cognitively.  He has fallen three times this week need EMS support to "get him off the floor".  He is taking very little po intake and had significant diarrhea yesterday.  Patient and family do not want to pursue any further life prolonging treatments.    Although she has some in-home help through their long term care insurance it is getting "almost impossible " to provide safe care at home.  We discussed the natural trajectory and expectations at EOL.  Questions and concerns addressed  She is open to hospice services and when Philip Shields is eligible her hope is for [facility].  I made a referral to [facility] for hospice evaluation and hospice facility placement.  Philip Shields is the PCP and [facility] plans to contact him this morning.  Call with any questions or concerns.  [name] NP  [phone]

## 2015-08-23 NOTE — Telephone Encounter (Signed)
Philip Shields with [facility] on 08/23/15 at 9:18am.   She stated that pts oncologist referred pt to them for in home care. She stated that she needs to know if Dr. Anitra Lauth will be the attending provider. Per Dr. Anitra Lauth he will be the provider for record but he would like for Hospice providers to take care of symptom manage ment. Dewaine Oats advised and voiced understanding.

## 2015-08-23 NOTE — Telephone Encounter (Signed)
Order faxed to [facility] at [phone].

## 2015-08-23 NOTE — Telephone Encounter (Signed)
OK, order written, placed on your desk.-thx

## 2015-08-23 NOTE — Telephone Encounter (Signed)
Received the following email from the patient's wife this morning:  Dear Philip Shields did come and I think he was surprised at how much his father had weakened.   Zoe was a zombie for last Tuesday and Wednesday. He began to perk up on Thursday and Friday and he stayed up in the morning on Saturday.  He fell on Saturday trying to get out of bed and Shanon Brow and I luckily got him back in bed.  He fell again on Sunday night trying to get to the bathroom.  I got him back in bed.  He is very weak now and last night because of his not eating and his drinking Ensure (I think), he had diarrhea.  It was really bad.  He is so weak that he cannot stand unassisted and he cannot help me move his legs in the bed or raise is head very much. I called Hospice for what I thought was [facility], but it turned out to be a Hospice in [place].  They talked to me about what they could do for him and it sounded very good, but now I do not know what to do. He does not want any more treatments.  He is very, very weak.  The woman who is helping me with in-home care can bathe him and cajole him, but I don't know if what I am doing is best for him. Could you please advise me? Thank you for all you do and have done.  I called [name] this morning. [facility] at [place], [place] [address] Office: [phone] Home [phone] E-mail: [email or web address]  Informed Cariah, RT on L1 of this finding. Requested she closed out patient's chart and cancel all radiation appointments. Reached out to [name] via phone. [name], NP will phone patient and assist in arranging hospice possibly residential hospice.

## 2015-08-24 ENCOUNTER — Ambulatory Visit: Payer: Medicare Other

## 2015-08-25 ENCOUNTER — Ambulatory Visit: Payer: Medicare Other

## 2015-08-25 DIAGNOSIS — C719 Malignant neoplasm of brain, unspecified: Secondary | ICD-10-CM | POA: Diagnosis not present

## 2015-08-26 ENCOUNTER — Ambulatory Visit: Payer: Medicare Other

## 2015-08-26 ENCOUNTER — Telehealth: Payer: Self-pay | Admitting: Radiation Oncology

## 2015-08-26 ENCOUNTER — Encounter: Payer: Self-pay | Admitting: Radiation Oncology

## 2015-08-26 NOTE — Telephone Encounter (Signed)
Rx was sent 08/17/15.

## 2015-08-26 NOTE — Telephone Encounter (Signed)
Faxed signed [facility] to [phone]. Fax confirmation of delivery obtained. Took orange fold to [employer].

## 2015-08-26 NOTE — Progress Notes (Signed)
Long term care insurance papers with Sam RN for completion

## 2015-08-27 ENCOUNTER — Ambulatory Visit: Payer: Medicare Other

## 2015-08-29 ENCOUNTER — Encounter: Payer: Self-pay | Admitting: Radiation Oncology

## 2015-08-29 ENCOUNTER — Telehealth: Payer: Self-pay | Admitting: Radiation Oncology

## 2015-08-29 ENCOUNTER — Ambulatory Visit: Payer: Medicare Other

## 2015-08-29 NOTE — Telephone Encounter (Signed)
Placed orange folder with complete [place] paperwork in Dr. Johny Shears inbox to sign.

## 2015-08-29 NOTE — Progress Notes (Signed)
Paperwork faxed to [facility] @ [phone], confirmation received 08/26/15, je (08/29/15)

## 2015-08-31 ENCOUNTER — Telehealth: Payer: Self-pay | Admitting: Radiation Oncology

## 2015-08-31 NOTE — Telephone Encounter (Signed)
Several unsucessful attempts have been made to reach patient's wife via phone and email. Attempting again today to reach her to determine decadron dose and current status of patient. Patient's wife's home and cell phone both ring without answer or answering machine to leave message. Will continue to reach patient's wife.

## 2015-09-01 ENCOUNTER — Ambulatory Visit: Payer: Medicare Other

## 2015-09-02 ENCOUNTER — Telehealth: Payer: Self-pay | Admitting: *Deleted

## 2015-09-02 ENCOUNTER — Encounter: Payer: Self-pay | Admitting: Radiation Oncology

## 2015-09-02 NOTE — Telephone Encounter (Signed)
On 09-02-15 fax medical records to [employer]  It was consult note, sim & planning note.

## 2015-09-02 NOTE — Progress Notes (Signed)
Paperwork received from RN (sam), paperwork faxed to [employer] @ [phone], 09/02/15, confirmation received,  Ardeen Fillers)

## 2015-09-16 NOTE — Progress Notes (Signed)
  Radiation Oncology         [phone] ________________________________  Name: Philip Shields MRN: [id number]  Date: 08/15/2015  DOB: January 25, 1933  End of Treatment Note     ICD-9-CM ICD-10-CM   1. 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     Indication for treatment:  Palliative       Radiation treatment dates:   07/11/2015-08/15/2015  Site/dose:   The Left brain was treated to 45 Gy in 25 fractions of 1.8 Gy  Beams/energy:   The patient was treated using 6 MV X-rays using VMAT and daily image guidance.  Narrative: The patient tolerated radiation treatment relatively well.   The patient's overall condition deteriorated over time and due to declining performance status, his radiation treatment was discontinued.   Plan: The patient has completed radiation treatment. The patient will return to radiation oncology clinic for routine followup as needed. I advised him to call or return sooner if he has any questions or concerns related to his recovery or treatment. ________________________________  Sheral Apley. Tammi Klippel, M.D.  This document serves as a record of services personally performed by Tyler Pita, MD. It was created on his behalf by Arlyce Harman, a trained medical scribe. The creation of this record is based on the scribe's personal observations and the provider's statements to them. This document has been checked and approved by the attending provider.

## 2015-09-29 DEATH — deceased

## 2015-11-22 ENCOUNTER — Ambulatory Visit: Payer: Medicare Other | Admitting: Family Medicine

## 2016-07-17 ENCOUNTER — Telehealth: Payer: Self-pay | Admitting: Radiation Oncology

## 2016-07-17 NOTE — Telephone Encounter (Signed)
Gave Javata Epps complete and signed ATTENDING PHYSICIAN STATEMENT to scan into the EPIC record and mail back to patient's wife, Kendre Kollman for submission unless, she would like to pick up.

## 2017-04-13 IMAGING — MR MR HEAD W/O CM
9 of 10 series · 37 of 48 positions shown · non-contrast
Comparison: Prior MRI from 06/15/2015.

CLINICAL DATA: Initial evaluation for acute worsening aphasia out.
Confusion. Left facial droop.

EXAM:
MRI HEAD WITHOUT CONTRAST
TECHNIQUE: Multiplanar, multiecho pulse sequences of the brain and surrounding
structures were obtained without intravenous contrast.

[Series 4: DWI · axial · 3.0mm · 1.09mm/px · z∈[-66,+77]mm · 11 of 98 slices shown (1 of 4)]
[im 1/98]
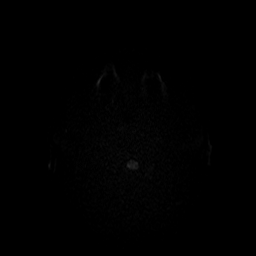
[im 10/98]
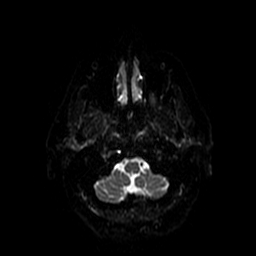
[im 20/98]
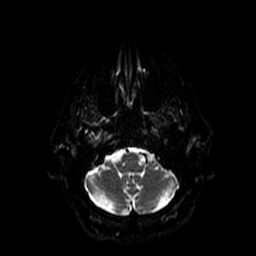
[im 30/98]
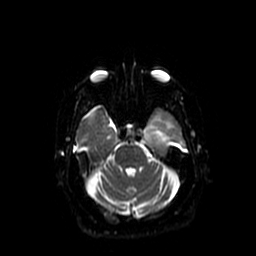
[im 39/98]
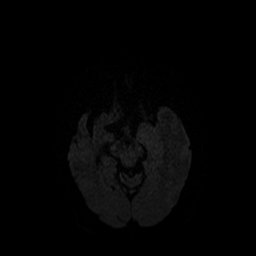
[im 49/98]
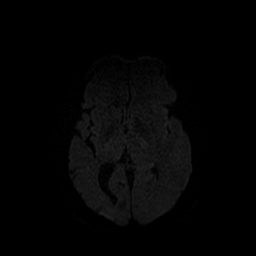
[im 59/98]
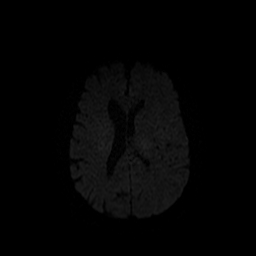
[im 68/98]
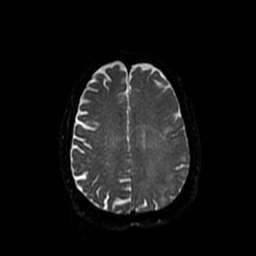
[im 78/98]
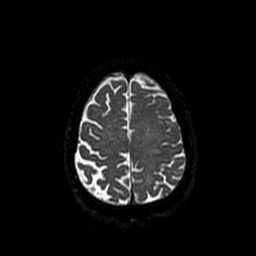
[im 88/98]
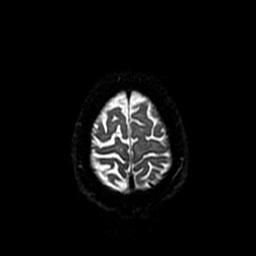
[im 98/98]
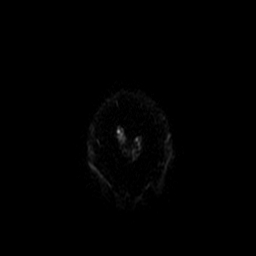

[Series 5: DWI · coronal · 5.0mm · 1.09mm/px · 7 of 76 slices shown (2 of 4)]
[im 1/76]
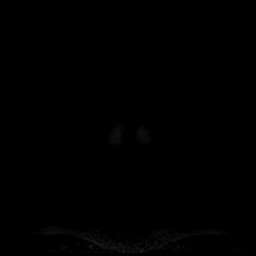
[im 13/76]
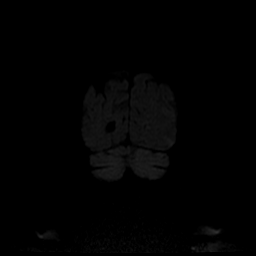
[im 26/76]
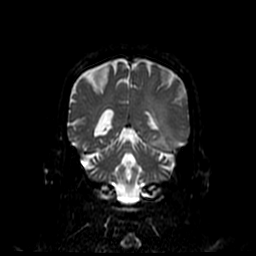
[im 38/76]
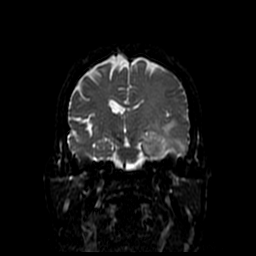
[im 51/76]
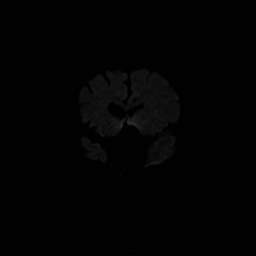
[im 63/76]
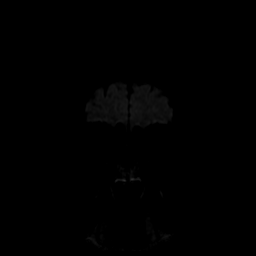
[im 76/76]
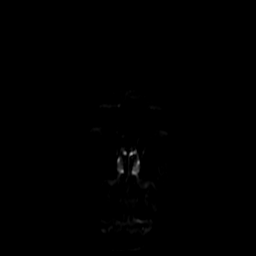

[Series 6: T1 · sagittal · 5.0mm · 0.47mm/px · 2 of 25 slices shown]
[im 1/25]
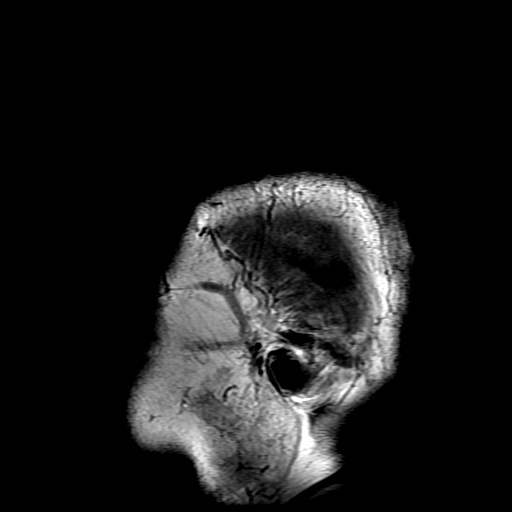
[im 25/25]
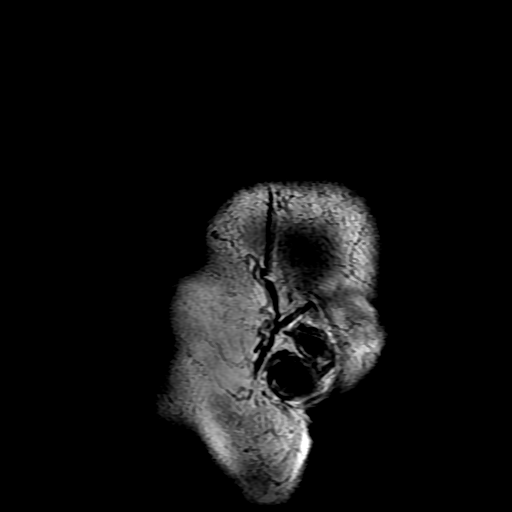

[Series 7: T2 · axial · 5.0mm · 0.47mm/px · z∈[-71,+72]mm · 2 of 25 slices shown (1 of 2)]
[im 1/25]
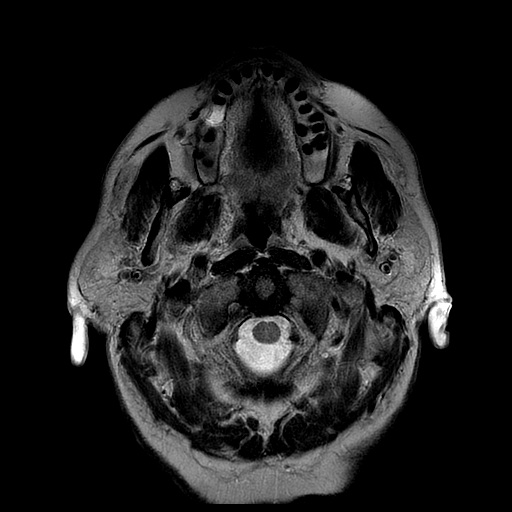
[im 25/25]
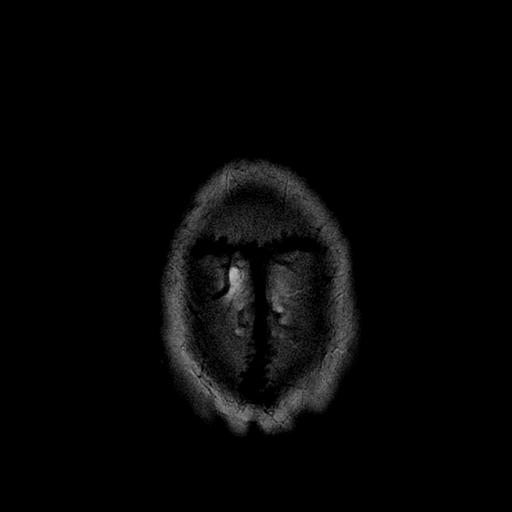

[Series 8: FLAIR · axial · 5.0mm · 0.47mm/px · z∈[-71,+72]mm · 2 of 25 slices shown]
[im 1/25]
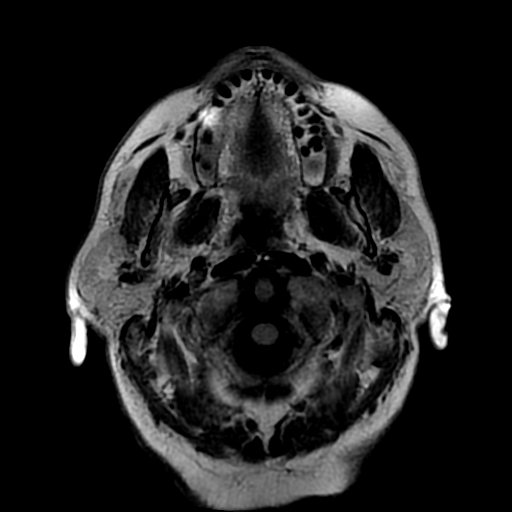
[im 25/25]
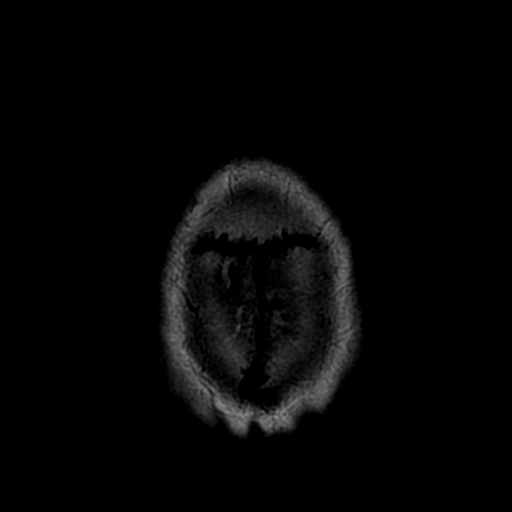

[Series 9: T2 · coronal · 5.0mm · 0.43mm/px · 3 of 32 slices shown (2 of 2)]
[im 1/32]
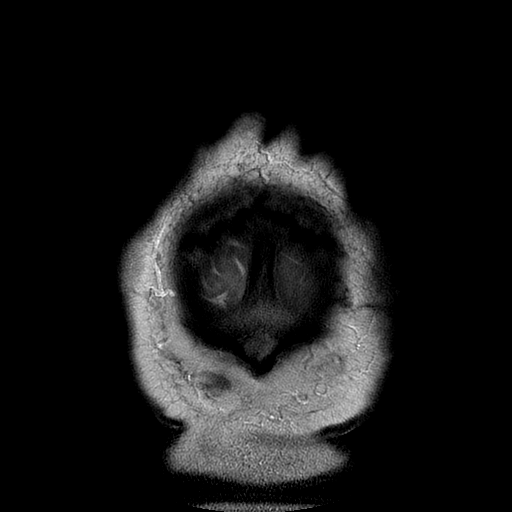
[im 16/32]
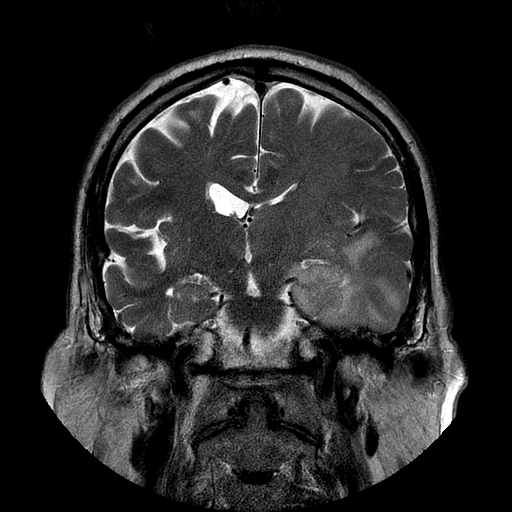
[im 32/32]
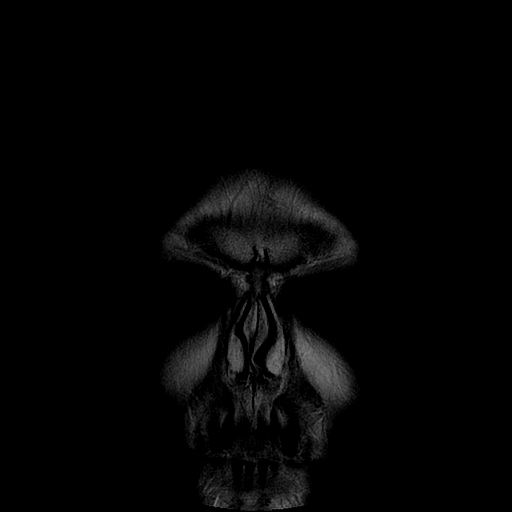

[Series 10: ax mpgr · axial · 5.0mm · 0.47mm/px · 1 of 25 slices shown]
[im 1/25]
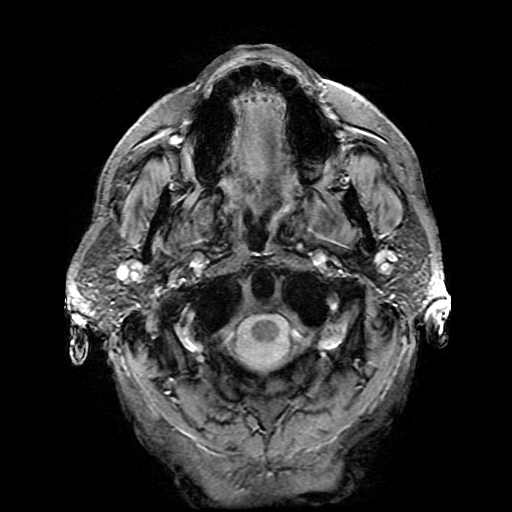

[Series 400: DWI · axial · 3.0mm · 1.09mm/px · z∈[-66,+77]mm · 5 of 49 slices shown (3 of 4)]
[im 1/49]
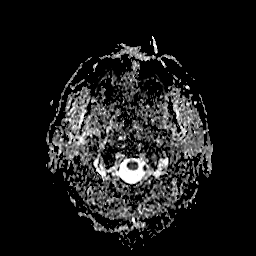
[im 13/49]
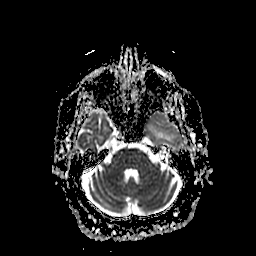
[im 25/49]
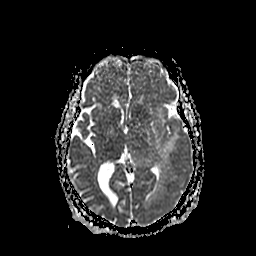
[im 37/49]
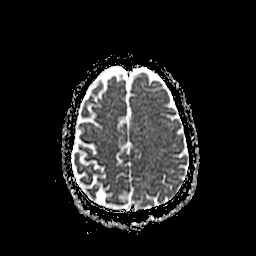
[im 49/49]
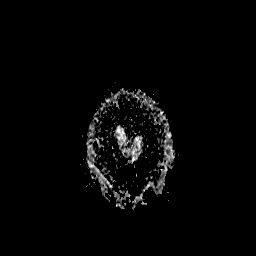

[Series 500: DWI · coronal · 5.0mm · 1.09mm/px · 4 of 38 slices shown (4 of 4)]
[im 1/38]
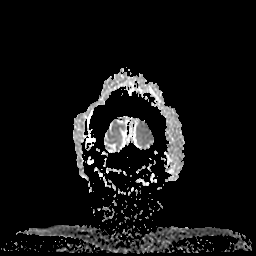
[im 13/38]
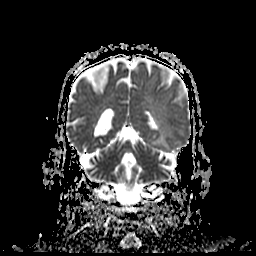
[im 25/38]
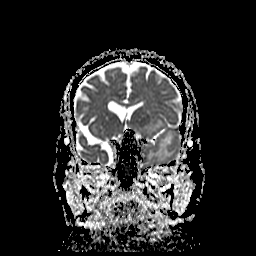
[im 38/38]
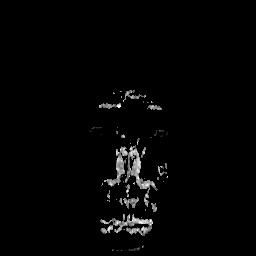

[37 of 48 positions shown; findings below may reference images not displayed]

FINDINGS: No abnormal foci of restricted diffusion to suggest acute
intracranial infarct. Normal intravascular flow voids maintained. No
acute intracranial hemorrhage. Single tiny chronic micro hemorrhage
noted within the right centrum semi ovale. No extra-axial fluid
collection.

Again seen is abnormal appearance of the left cerebral hemisphere.
Mass light T2/FLAIR hyperintensity involving the left temporal lobe,
with involvement of both the white matter and overlying gray matter.
Involvement of the left hippocampus. Extension into the left
parietal and occipital lobes as well, predominantly involving the
white matter. Left insula and subinsular region as well the left
thalamus involved as well. Associated partial effacement of the left
lateral ventricle with approximately 6 mm of left-to-right shift. No
hydrocephalus. Basilar cisterns remain patent. No infratentorial
involvement or extension into the right cerebral hemisphere.
Probable mild superimposed chronic small vessel ischemic disease
again noted.

Craniocervical junction within normal limits. Incidental note made
of a partially empty sella. Pituitary otherwise unremarkable. No
acute abnormality about the orbits. Sequelae of prior bilateral lens
extraction.

Mild mucosal thickening within the left maxillary sinus. No mastoid
effusion. Inner ear structures grossly normal.
IMPRESSION: 1. Stable appearance of mass slight T2 hyperintensity involving the
left temporal lobe with additional left cerebral hemispheric and
thalamic involvement as above. Again, findings concerning for
possible infiltrating glioma/ gliomatosis cerebri. Possible
encephalitis could also considered. Again, correlate with acuity of
clinical presentation and symptomatology.
2. 6 mm of left-to-right midline shift, stable.
3. No new acute abnormality.
